# Patient Record
Sex: Male | Born: 1955 | Race: White | Hispanic: No | Marital: Married | Smoking: Never smoker
Health system: Southern US, Community
[De-identification: ages and names within clinical notes are randomized; demographics above are authoritative.]

## PROBLEM LIST (undated history)

## (undated) DIAGNOSIS — M1990 Unspecified osteoarthritis, unspecified site: Secondary | ICD-10-CM

## (undated) DIAGNOSIS — H9319 Tinnitus, unspecified ear: Secondary | ICD-10-CM

## (undated) DIAGNOSIS — C801 Malignant (primary) neoplasm, unspecified: Secondary | ICD-10-CM

## (undated) DIAGNOSIS — L0591 Pilonidal cyst without abscess: Secondary | ICD-10-CM

## (undated) DIAGNOSIS — Z9889 Other specified postprocedural states: Secondary | ICD-10-CM

## (undated) DIAGNOSIS — I82409 Acute embolism and thrombosis of unspecified deep veins of unspecified lower extremity: Secondary | ICD-10-CM

## (undated) DIAGNOSIS — H9190 Unspecified hearing loss, unspecified ear: Secondary | ICD-10-CM

## (undated) DIAGNOSIS — N390 Urinary tract infection, site not specified: Secondary | ICD-10-CM

## (undated) DIAGNOSIS — R351 Nocturia: Secondary | ICD-10-CM

## (undated) DIAGNOSIS — I10 Essential (primary) hypertension: Secondary | ICD-10-CM

## (undated) DIAGNOSIS — D6851 Activated protein C resistance: Secondary | ICD-10-CM

## (undated) DIAGNOSIS — R112 Nausea with vomiting, unspecified: Secondary | ICD-10-CM

## (undated) DIAGNOSIS — I82403 Acute embolism and thrombosis of unspecified deep veins of lower extremity, bilateral: Secondary | ICD-10-CM

## (undated) DIAGNOSIS — G479 Sleep disorder, unspecified: Secondary | ICD-10-CM

## (undated) DIAGNOSIS — J189 Pneumonia, unspecified organism: Secondary | ICD-10-CM

## (undated) HISTORY — DX: Unspecified osteoarthritis, unspecified site: M19.90

## (undated) HISTORY — DX: Essential (primary) hypertension: I10

## (undated) HISTORY — PX: OTHER SURGICAL HISTORY: SHX169

## (undated) HISTORY — DX: Activated protein C resistance: D68.51

## (undated) HISTORY — DX: Pilonidal cyst without abscess: L05.91

## (undated) HISTORY — PX: TONSILLECTOMY: SUR1361

## (undated) HISTORY — DX: Acute embolism and thrombosis of unspecified deep veins of lower extremity, bilateral: I82.403

## (undated) HISTORY — DX: Acute embolism and thrombosis of unspecified deep veins of unspecified lower extremity: I82.409

## (undated) HISTORY — PX: EYE SURGERY: SHX253

## (undated) HISTORY — DX: Sleep disorder, unspecified: G47.9

---
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
## 1976-12-01 HISTORY — PX: OTHER SURGICAL HISTORY: SHX169

## 2004-10-14 ENCOUNTER — Ambulatory Visit (HOSPITAL_COMMUNITY): Admission: RE | Admit: 2004-10-14 | Discharge: 2004-10-14 | Payer: Self-pay | Admitting: *Deleted

## 2004-10-14 ENCOUNTER — Encounter (INDEPENDENT_AMBULATORY_CARE_PROVIDER_SITE_OTHER): Payer: Self-pay | Admitting: *Deleted

## 2006-10-31 DIAGNOSIS — I82409 Acute embolism and thrombosis of unspecified deep veins of unspecified lower extremity: Secondary | ICD-10-CM

## 2006-10-31 HISTORY — DX: Acute embolism and thrombosis of unspecified deep veins of unspecified lower extremity: I82.409

## 2006-11-20 ENCOUNTER — Emergency Department (HOSPITAL_COMMUNITY): Admission: EM | Admit: 2006-11-20 | Discharge: 2006-11-20 | Payer: Self-pay | Admitting: Emergency Medicine

## 2006-12-29 ENCOUNTER — Ambulatory Visit: Payer: Self-pay | Admitting: Oncology

## 2007-03-19 ENCOUNTER — Ambulatory Visit: Payer: Self-pay | Admitting: Oncology

## 2007-03-24 LAB — CBC WITH DIFFERENTIAL/PLATELET
BASO%: 0.6 % (ref 0.0–2.0)
Eosinophils Absolute: 0.2 10*3/uL (ref 0.0–0.5)
HCT: 43.8 % (ref 38.7–49.9)
MCH: 31.3 pg (ref 28.0–33.4)
MCHC: 35 g/dL (ref 32.0–35.9)
MCV: 89.4 fL (ref 81.6–98.0)
MONO#: 0.4 10*3/uL (ref 0.1–0.9)
RBC: 4.9 10*6/uL (ref 4.20–5.71)
RDW: 14.6 % (ref 11.2–14.6)
WBC: 6.4 10*3/uL (ref 4.0–10.0)
lymph#: 1.4 10*3/uL (ref 0.9–3.3)

## 2007-03-24 LAB — PROTIME-INR

## 2007-03-24 LAB — MORPHOLOGY

## 2007-03-24 LAB — D-DIMER, QUANTITATIVE: D-Dimer, Quant: 0.22 ug/mL-FEU (ref 0.00–0.48)

## 2007-04-16 ENCOUNTER — Ambulatory Visit: Payer: Self-pay | Admitting: Vascular Surgery

## 2007-09-14 ENCOUNTER — Ambulatory Visit: Payer: Self-pay | Admitting: Oncology

## 2007-11-30 ENCOUNTER — Ambulatory Visit: Payer: Self-pay | Admitting: Oncology

## 2007-12-10 LAB — CBC WITH DIFFERENTIAL/PLATELET
BASO%: 0.2 % (ref 0.0–2.0)
EOS%: 2.5 % (ref 0.0–7.0)
Eosinophils Absolute: 0.2 10*3/uL (ref 0.0–0.5)
HCT: 43.2 % (ref 38.7–49.9)
HGB: 14.7 g/dL (ref 13.0–17.1)
MONO%: 7.2 % (ref 0.0–13.0)
NEUT#: 4.5 10*3/uL (ref 1.5–6.5)
NEUT%: 60.7 % (ref 40.0–75.0)
Platelets: 269 10*3/uL (ref 145–400)
RBC: 4.79 10*6/uL (ref 4.20–5.71)
RDW: 14.3 % (ref 11.2–14.6)
lymph#: 2.2 10*3/uL (ref 0.9–3.3)

## 2007-12-10 LAB — PROTIME-INR: INR: 3 (ref 2.00–3.50)

## 2008-05-18 IMAGING — CR DG CHEST 2V
1 series · 2 of 2 positions shown · non-contrast
Comparison: NONE

CLINICAL DATA: Hypertension. 

CHEST TWO VIEW (PA AND LATERAL)

[Series 1: view not recorded · 0.17mm/px · 2 of 2 slices shown]
[im 1/2]
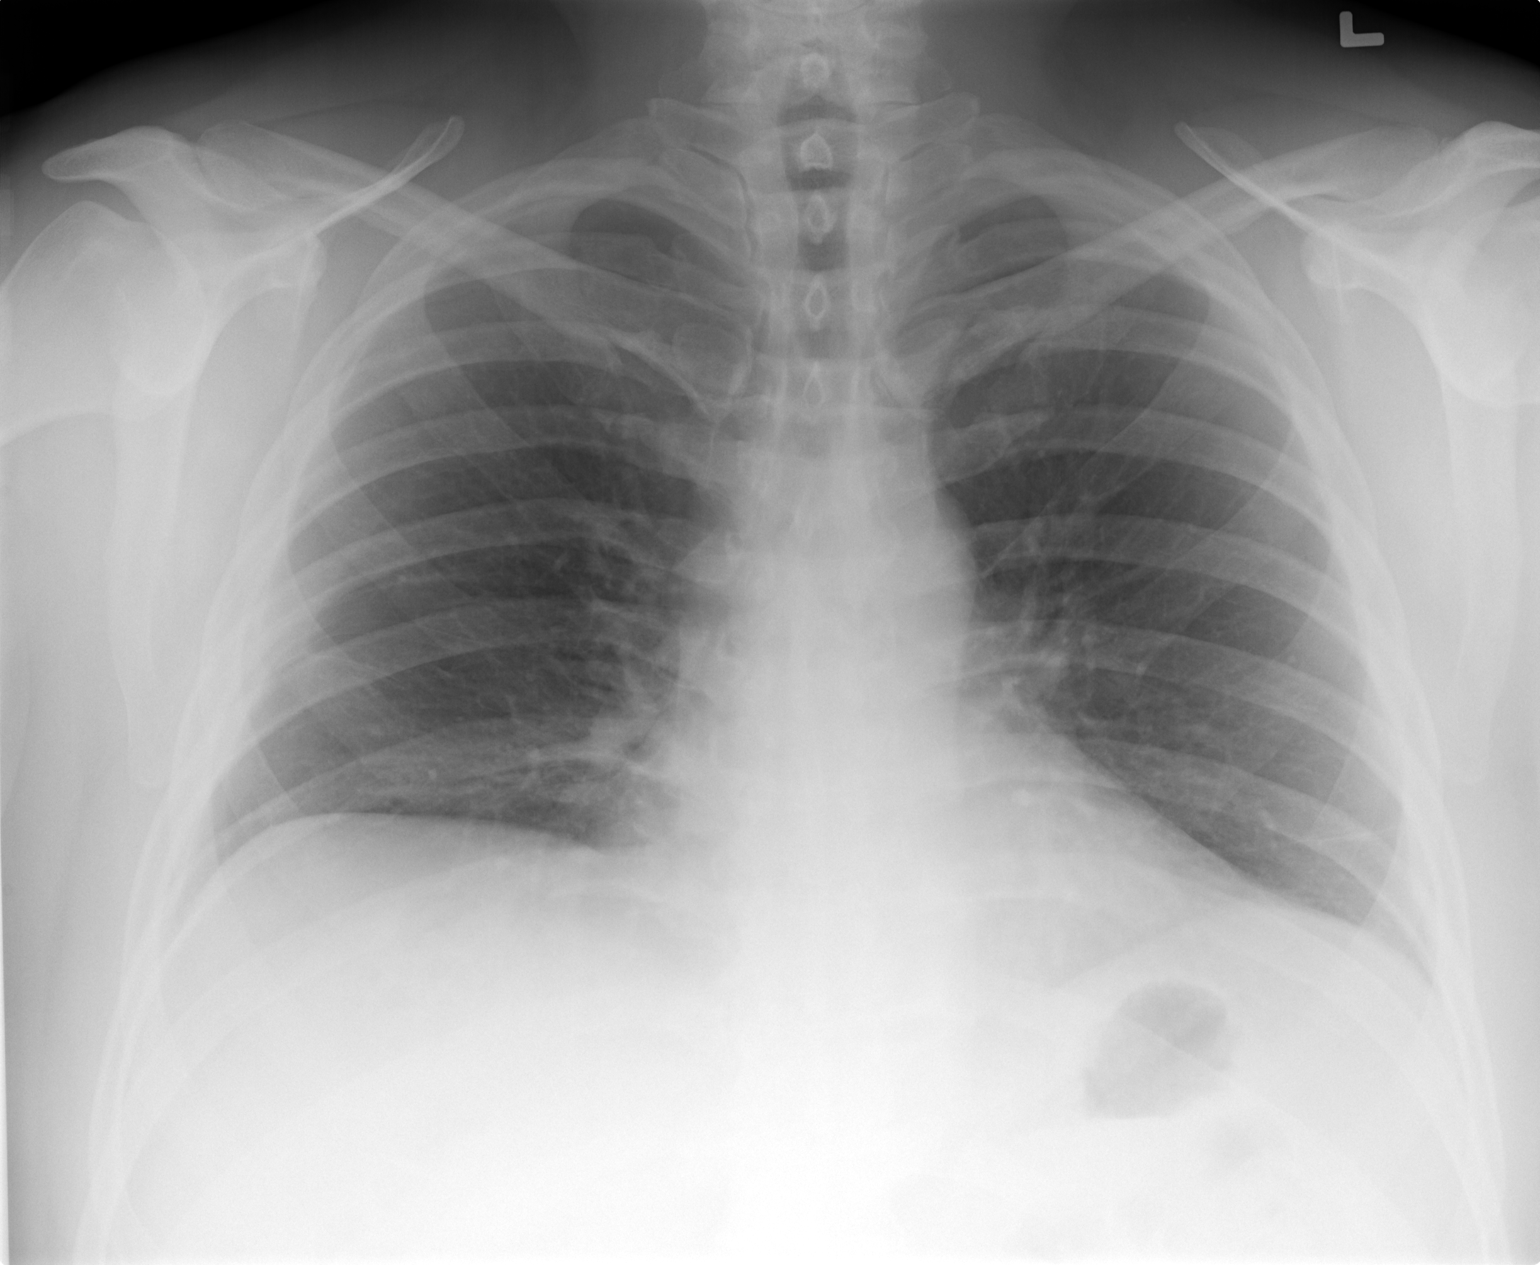
[im 2/2]
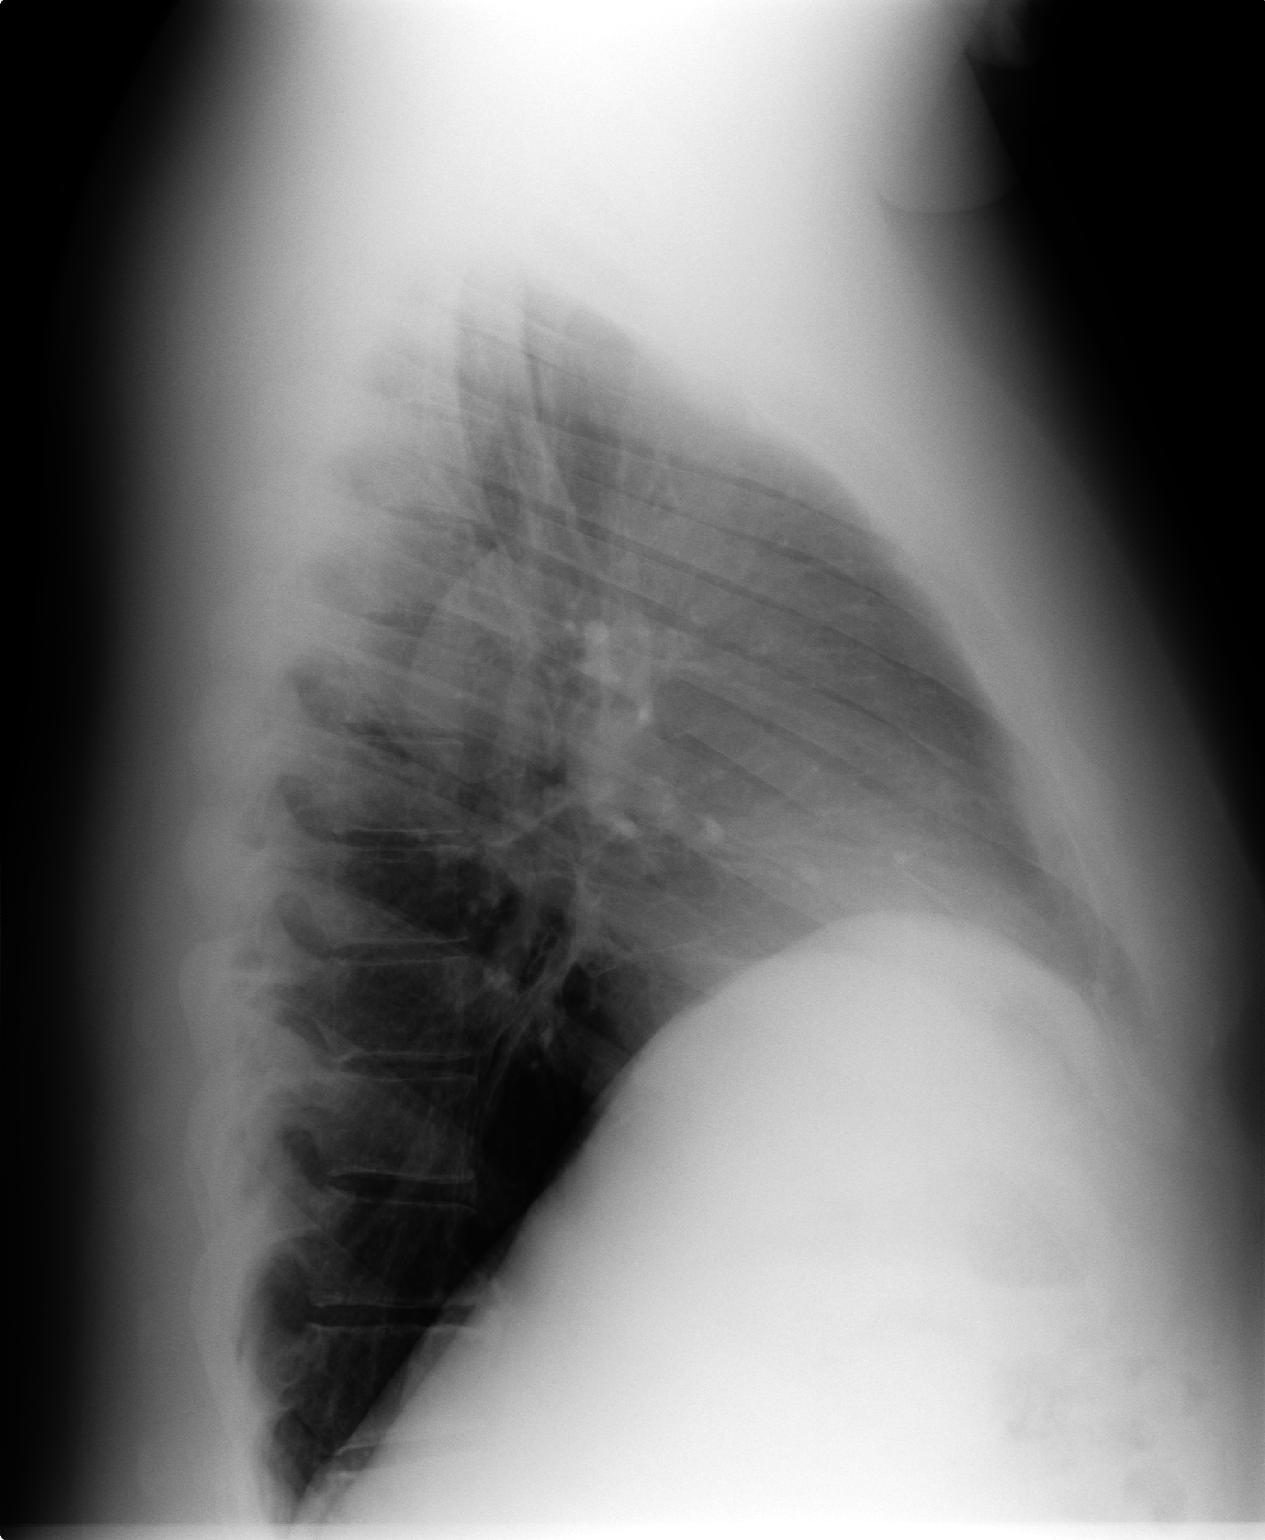

[2 of 2 positions shown; findings below may reference images not displayed]

FINDINGS: Comparison is made to a prior study dated [DATE]. The 
lungs are clear and well expanded.   Heart and pulmonary vessels 
are normal.  No significant abnormalities are noted in the 
regional skeleton.  No evidence of thoracic aortic aneurysm or 
dissection. 

electronically reviewed on [DATE] Dict Date: [DATE]  Tran 
Date:  [DATE] DAS  [REDACTED]

## 2009-12-21 ENCOUNTER — Ambulatory Visit: Payer: Self-pay | Admitting: Oncology

## 2009-12-25 LAB — CBC WITH DIFFERENTIAL/PLATELET
BASO%: 0.7 % (ref 0.0–2.0)
Basophils Absolute: 0 10*3/uL (ref 0.0–0.1)
Eosinophils Absolute: 0.2 10*3/uL (ref 0.0–0.5)
HCT: 47.4 % (ref 38.4–49.9)
LYMPH%: 29 % (ref 14.0–49.0)
MCH: 30.8 pg (ref 27.2–33.4)
MCV: 89.1 fL (ref 79.3–98.0)
NEUT#: 3.6 10*3/uL (ref 1.5–6.5)
NEUT%: 59.5 % (ref 39.0–75.0)
Platelets: 197 10*3/uL (ref 140–400)
lymph#: 1.7 10*3/uL (ref 0.9–3.3)

## 2009-12-25 LAB — PROTIME-INR
INR: 2 (ref 2.00–3.50)
Protime: 24 Seconds — ABNORMAL HIGH (ref 10.6–13.4)

## 2010-04-24 ENCOUNTER — Encounter: Admission: RE | Admit: 2010-04-24 | Discharge: 2010-04-24 | Payer: Self-pay | Admitting: Gastroenterology

## 2010-06-21 ENCOUNTER — Ambulatory Visit: Payer: Self-pay | Admitting: Oncology

## 2012-06-04 ENCOUNTER — Telehealth: Payer: Self-pay | Admitting: *Deleted

## 2012-06-04 NOTE — Telephone Encounter (Signed)
A script was faxed to [facility] stating please provide pt with size larger A.E.Hose 20-30 compression.

## 2013-08-23 ENCOUNTER — Telehealth: Payer: Self-pay | Admitting: *Deleted

## 2013-08-23 NOTE — Telephone Encounter (Signed)
Spoke with patient regarding pending prostate biopsy. Pt states he spoke with his PCP and they decided to wait 4-5 months for another lab check. Will be for repeat labs in January or February. Will let Dr Cyndie Chime know if he schedules a biopsy re: Coumadin

## 2014-01-02 ENCOUNTER — Telehealth: Payer: Self-pay | Admitting: *Deleted

## 2014-01-02 NOTE — Telephone Encounter (Signed)
[facility] with [facility] called.  Dr. Mar Daring is planning on doing an ultrasound and biopsy of the prostate.  He would like Dr. Beryle Beams to give clearance to stop his coumadin and determine if he needs a lovenox bridge.   They requested that we fax written documentation to [phone].  Angel's call back is [phone]. Spoke with [employer]  Let her know that Dr. Beryle Beams is out of town at present.  Let her know that we have not seen this patient in over 4 yrs. Last labs were with last MD visit on 12-25-09.  She will check with patient and see who has been monitoring his labs in the meantime.

## 2014-01-05 ENCOUNTER — Encounter: Payer: Self-pay | Admitting: General Surgery

## 2014-01-05 DIAGNOSIS — E669 Obesity, unspecified: Secondary | ICD-10-CM | POA: Insufficient documentation

## 2014-01-05 DIAGNOSIS — I10 Essential (primary) hypertension: Secondary | ICD-10-CM | POA: Insufficient documentation

## 2014-01-10 NOTE — Telephone Encounter (Signed)
Late Entry:  Spoke with pt 01/05/14 & Dr Melinda Crutch is monitoring his coumadin & he states that is is on coumadin by his choice & biopsy is planned for March 20, 2014 & he will talk with Dr Harrington Challenger to see if he will manage coumadin for surgery.  He states that he will get back with [place] if he needs Dr Azucena Freed help.  Pt reports that he would like to see Dr Benay Spice if he needs a hematologist in the future.  Dr. Beryle Beams was informed.

## 2014-05-04 ENCOUNTER — Ambulatory Visit (INDEPENDENT_AMBULATORY_CARE_PROVIDER_SITE_OTHER): Payer: Self-pay | Admitting: General Surgery

## 2014-05-08 ENCOUNTER — Telehealth (INDEPENDENT_AMBULATORY_CARE_PROVIDER_SITE_OTHER): Payer: Self-pay

## 2014-05-08 NOTE — Telephone Encounter (Signed)
Made Dr. Eulogio Ditch aware that his referred patient for bilateral inguinal hernias was a "no show", and did not reschedule.

## 2014-09-11 ENCOUNTER — Other Ambulatory Visit: Payer: Self-pay | Admitting: Urology

## 2014-09-11 DIAGNOSIS — C61 Malignant neoplasm of prostate: Secondary | ICD-10-CM

## 2014-10-09 ENCOUNTER — Ambulatory Visit (HOSPITAL_COMMUNITY)
Admission: RE | Admit: 2014-10-09 | Discharge: 2014-10-09 | Disposition: A | Discharge status: Home / Self Care | Payer: BC Managed Care – PPO | Source: Ambulatory Visit | Attending: Urology | Admitting: Urology

## 2014-10-09 DIAGNOSIS — N323 Diverticulum of bladder: Secondary | ICD-10-CM | POA: Diagnosis not present

## 2014-10-09 DIAGNOSIS — C61 Malignant neoplasm of prostate: Secondary | ICD-10-CM | POA: Diagnosis present

## 2014-10-09 MED ORDER — GADOBENATE DIMEGLUMINE 529 MG/ML IV SOLN
20.0000 mL | Freq: Once | INTRAVENOUS | Status: AC | PRN
  Administered 2014-10-09: 18 mL via INTRAVENOUS

## 2014-11-01 ENCOUNTER — Other Ambulatory Visit: Payer: Self-pay | Admitting: Urology

## 2014-11-03 ENCOUNTER — Other Ambulatory Visit (HOSPITAL_COMMUNITY): Payer: Self-pay | Admitting: Urology

## 2014-11-03 DIAGNOSIS — C61 Malignant neoplasm of prostate: Secondary | ICD-10-CM

## 2014-11-13 ENCOUNTER — Encounter (HOSPITAL_COMMUNITY)
Admission: RE | Admit: 2014-11-13 | Discharge: 2014-11-13 | Disposition: A | Discharge status: Home / Self Care | Payer: BC Managed Care – PPO | Source: Ambulatory Visit | Attending: Urology | Admitting: Urology

## 2014-11-13 ENCOUNTER — Ambulatory Visit (HOSPITAL_COMMUNITY)
Admission: RE | Admit: 2014-11-13 | Discharge: 2014-11-13 | Disposition: A | Discharge status: Home / Self Care | Payer: BC Managed Care – PPO | Source: Ambulatory Visit | Attending: Urology | Admitting: Urology

## 2014-11-13 DIAGNOSIS — C61 Malignant neoplasm of prostate: Secondary | ICD-10-CM

## 2014-11-13 MED ORDER — TECHNETIUM TC 99M MEDRONATE IV KIT
26.1000 | PACK | Freq: Once | INTRAVENOUS | Status: AC | PRN
  Administered 2014-11-13: 26.1 via INTRAVENOUS

## 2014-12-11 NOTE — Progress Notes (Addendum)
Call to Dr Lynne Logan oddice requested   Orders be put  in Epic surgery 12-25-14 pre op 12-19-14 Thanks

## 2014-12-15 ENCOUNTER — Other Ambulatory Visit (HOSPITAL_COMMUNITY): Payer: Self-pay | Admitting: *Deleted

## 2014-12-15 NOTE — Patient Instructions (Addendum)
[facility]  12/15/2014   Your procedure is scheduled on: Monday January 25th, 2016  Report to [facility] Main  Entrance and follow signs to               [place] at 1000 AM.  Call this number if you have problems the morning of surgery [phone]   Remember: follow all bowel prep instructions  Do not eat food :After Midnight Saturday night, clear liquids all day Sunday 12-24-2014, no clear liquds after midnight Sunday night.     Take these medicines the morning of surgery with A SIP OF WATER: flomax                               You may not have any metal on your body including hair pins and              piercings  Do not wear jewelry, make-up, lotions, powders or perfumes.             Do not wear nail polish.  Do not shave  48 hours prior to surgery.              Men may shave face and neck.   Do not bring valuables to the hospital. [place].  Contacts, dentures or bridgework may not be worn into surgery.  Leave suitcase in the car. After surgery it may be brought to your room.     Patients discharged the day of surgery will not be allowed to drive home.  Name and phone number of your driver:  Special Instructions: N/A              Please read over the following fact sheets you were given: _____________________________________________________________________             [facility] - Preparing for Surgery Before surgery, you can play an important role.  Because skin is not sterile, your skin needs to be as free of germs as possible.  You can reduce the number of germs on your skin by washing with CHG (chlorahexidine gluconate) soap before surgery.  CHG is an antiseptic cleaner which kills germs and bonds with the skin to continue killing germs even after washing. Please DO NOT use if you have an allergy to CHG or antibacterial soaps.  If your skin becomes reddened/irritated stop using the  CHG and inform your nurse when you arrive at Short Stay. Do not shave (including legs and underarms) for at least 48 hours prior to the first CHG shower.  You may shave your face/neck. Please follow these instructions carefully:  1.  Shower with CHG Soap the night before surgery and the  morning of Surgery.  2.  If you choose to wash your hair, wash your hair first as usual with your  normal  shampoo.  3.  After you shampoo, rinse your hair and body thoroughly to remove the  shampoo.                           4.  Use CHG as you would any other liquid soap.  You can apply chg directly  to the skin and wash  Gently with a scrungie or clean washcloth.  5.  Apply the CHG Soap to your body ONLY FROM THE NECK DOWN.   Do not use on face/ open                           Wound or open sores. Avoid contact with eyes, ears mouth and genitals (private parts).                       Wash face,  Genitals (private parts) with your normal soap.             6.  Wash thoroughly, paying special attention to the area where your surgery  will be performed.  7.  Thoroughly rinse your body with warm water from the neck down.  8.  DO NOT shower/wash with your normal soap after using and rinsing off  the CHG Soap.                9.  Pat yourself dry with a clean towel.            10.  Wear clean pajamas.            11.  Place clean sheets on your bed the night of your first shower and do not  sleep with pets. Day of Surgery : Do not apply any lotions/deodorants the morning of surgery.  Please wear clean clothes to the hospital/surgery center.  FAILURE TO FOLLOW THESE INSTRUCTIONS MAY RESULT IN THE CANCELLATION OF YOUR SURGERY PATIENT SIGNATURE_________________________________  NURSE SIGNATURE__________________________________  ________________________________________________________________________   [facility]  An incentive spirometer is a tool that can help keep your lungs clear  and active. This tool measures how well you are filling your lungs with each breath. Taking long deep breaths may help reverse or decrease the chance of developing breathing (pulmonary) problems (especially infection) following:  A long period of time when you are unable to move or be active. BEFORE THE PROCEDURE   If the spirometer includes an indicator to show your best effort, your nurse or respiratory therapist will set it to a desired goal.  If possible, sit up straight or lean slightly forward. Try not to slouch.  Hold the incentive spirometer in an upright position. INSTRUCTIONS FOR USE   Sit on the edge of your bed if possible, or sit up as far as you can in bed or on a chair.  Hold the incentive spirometer in an upright position.  Breathe out normally.  Place the mouthpiece in your mouth and seal your lips tightly around it.  Breathe in slowly and as deeply as possible, raising the piston or the ball toward the top of the column.  Hold your breath for 3-5 seconds or for as long as possible. Allow the piston or ball to fall to the bottom of the column.  Remove the mouthpiece from your mouth and breathe out normally.  Rest for a few seconds and repeat Steps 1 through 7 at least 10 times every 1-2 hours when you are awake. Take your time and take a few normal breaths between deep breaths.  The spirometer may include an indicator to show your best effort. Use the indicator as a goal to work toward during each repetition.  After each set of 10 deep breaths, practice coughing to be sure your lungs are clear. If you have an incision (the cut made at the time of surgery),  support your incision when coughing by placing a pillow or rolled up towels firmly against it. Once you are able to get out of bed, walk around indoors and cough well. You may stop using the incentive spirometer when instructed by your caregiver.  RISKS AND COMPLICATIONS  Take your time so you do not get dizzy or  light-headed.  If you are in pain, you may need to take or ask for pain medication before doing incentive spirometry. It is harder to take a deep breath if you are having pain. AFTER USE  Rest and breathe slowly and easily.  It can be helpful to keep track of a log of your progress. Your caregiver can provide you with a simple table to help with this. If you are using the spirometer at home, follow these instructions: [place] IF:   You are having difficultly using the spirometer.  You have trouble using the spirometer as often as instructed.  Your pain medication is not giving enough relief while using the spirometer.  You develop fever of 100.5 F (38.1 C) or higher. SEEK IMMEDIATE MEDICAL CARE IF:   You cough up bloody sputum that had not been present before.  You develop fever of 102 F (38.9 C) or greater.  You develop worsening pain at or near the incision site. MAKE SURE YOU:   Understand these instructions.  Will watch your condition.  Will get help right away if you are not doing well or get worse. Document Released: 03/30/2007 Document Revised: 02/09/2012 Document Reviewed: 05/31/2007 Exit[employer] Patient Information 2014 [employer], [place].   ________________________________________________________________________  WHAT IS A BLOOD TRANSFUSION? Blood Transfusion Information  A transfusion is the replacement of blood or some of its parts. Blood is made up of multiple cells which provide different functions.  Red blood cells carry oxygen and are used for blood loss replacement.  White blood cells fight against infection.  Platelets control bleeding.  Plasma helps clot blood.  Other blood products are available for specialized needs, such as hemophilia or other clotting disorders. BEFORE THE TRANSFUSION  Who gives blood for transfusions?   Healthy volunteers who are fully evaluated to make sure their blood is safe. This is blood bank  blood. Transfusion therapy is the safest it has ever been in the practice of medicine. Before blood is taken from a donor, a complete history is taken to make sure that person has no history of diseases nor engages in risky social behavior (examples are intravenous drug use or sexual activity with multiple partners). The donor's travel history is screened to minimize risk of transmitting infections, such as malaria. The donated blood is tested for signs of infectious diseases, such as HIV and hepatitis. The blood is then tested to be sure it is compatible with you in order to minimize the chance of a transfusion reaction. If you or a relative donates blood, this is often done in anticipation of surgery and is not appropriate for emergency situations. It takes many days to process the donated blood. RISKS AND COMPLICATIONS Although transfusion therapy is very safe and saves many lives, the main dangers of transfusion include:   Getting an infectious disease.  Developing a transfusion reaction. This is an allergic reaction to something in the blood you were given. Every precaution is taken to prevent this. The decision to have a blood transfusion has been considered carefully by your caregiver before blood is given. Blood is not given unless the benefits outweigh the risks. AFTER THE TRANSFUSION  Right after receiving a blood transfusion, you will usually feel much better and more energetic. This is especially true if your red blood cells have gotten low (anemic). The transfusion raises the level of the red blood cells which carry oxygen, and this usually causes an energy increase.  The nurse administering the transfusion will monitor you carefully for complications. HOME CARE INSTRUCTIONS  No special instructions are needed after a transfusion. You may find your energy is better. Speak with your caregiver about any limitations on activity for underlying diseases you may have. SEEK MEDICAL CARE IF:    Your condition is not improving after your transfusion.  You develop redness or irritation at the intravenous (IV) site. SEEK IMMEDIATE MEDICAL CARE IF:  Any of the following symptoms occur over the next 12 hours:  Shaking chills.  You have a temperature by mouth above 102 F (38.9 C), not controlled by medicine.  Chest, back, or muscle pain.  People around you feel you are not acting correctly or are confused.  Shortness of breath or difficulty breathing.  Dizziness and fainting.  You get a rash or develop hives.  You have a decrease in urine output.  Your urine turns a dark color or changes to pink, red, or brown. Any of the following symptoms occur over the next 10 days:  You have a temperature by mouth above 102 F (38.9 C), not controlled by medicine.  Shortness of breath.  Weakness after normal activity.  The white part of the eye turns yellow (jaundice).  You have a decrease in the amount of urine or are urinating less often.  Your urine turns a dark color or changes to pink, red, or brown. Document Released: 11/14/2000 Document Revised: 02/09/2012 Document Reviewed: 07/03/2008 [facility] Patient Information 2014 [place], [place].  _______________________________________________________________________

## 2014-12-19 ENCOUNTER — Other Ambulatory Visit: Payer: Self-pay | Admitting: Urology

## 2014-12-19 ENCOUNTER — Encounter (HOSPITAL_COMMUNITY): Payer: Self-pay

## 2014-12-19 ENCOUNTER — Ambulatory Visit (HOSPITAL_COMMUNITY)
Admission: RE | Admit: 2014-12-19 | Discharge: 2014-12-19 | Disposition: A | Discharge status: Home / Self Care | Payer: BLUE CROSS/BLUE SHIELD | Source: Ambulatory Visit | Attending: Urology | Admitting: Urology

## 2014-12-19 ENCOUNTER — Encounter (HOSPITAL_COMMUNITY)
Admission: RE | Admit: 2014-12-19 | Discharge: 2014-12-19 | Disposition: A | Discharge status: Home / Self Care | Payer: BLUE CROSS/BLUE SHIELD | Source: Ambulatory Visit | Attending: Urology | Admitting: Urology

## 2014-12-19 ENCOUNTER — Encounter (INDEPENDENT_AMBULATORY_CARE_PROVIDER_SITE_OTHER): Payer: Self-pay

## 2014-12-19 DIAGNOSIS — Z01811 Encounter for preprocedural respiratory examination: Secondary | ICD-10-CM | POA: Insufficient documentation

## 2014-12-19 DIAGNOSIS — I10 Essential (primary) hypertension: Secondary | ICD-10-CM | POA: Insufficient documentation

## 2014-12-19 DIAGNOSIS — C61 Malignant neoplasm of prostate: Secondary | ICD-10-CM | POA: Diagnosis not present

## 2014-12-19 HISTORY — DX: Nausea with vomiting, unspecified: R11.2

## 2014-12-19 HISTORY — DX: Malignant (primary) neoplasm, unspecified: C80.1

## 2014-12-19 HISTORY — DX: Other specified postprocedural states: Z98.890

## 2014-12-19 LAB — PROTIME-INR
INR: 2.76 — AB (ref 0.00–1.49)
Prothrombin Time: 29.4 seconds — ABNORMAL HIGH (ref 11.6–15.2)

## 2014-12-19 LAB — BASIC METABOLIC PANEL
ANION GAP: 5 (ref 5–15)
BUN: 25 mg/dL — ABNORMAL HIGH (ref 6–23)
CALCIUM: 8.9 mg/dL (ref 8.4–10.5)
CO2: 30 mmol/L (ref 19–32)
Chloride: 104 mEq/L (ref 96–112)
Creatinine, Ser: 1.3 mg/dL (ref 0.50–1.35)
GFR, EST AFRICAN AMERICAN: 67 mL/min — AB (ref >90)
GFR, EST NON AFRICAN AMERICAN: 58 mL/min — AB (ref >90)
GLUCOSE: 89 mg/dL (ref 70–99)
Potassium: 4.2 mmol/L (ref 3.5–5.1)
SODIUM: 139 mmol/L (ref 135–145)

## 2014-12-19 LAB — CBC
HCT: 44.4 % (ref 39.0–52.0)
Hemoglobin: 14.9 g/dL (ref 13.0–17.0)
MCH: 31.4 pg (ref 26.0–34.0)
MCHC: 33.6 g/dL (ref 30.0–36.0)
MCV: 93.5 fL (ref 78.0–100.0)
Platelets: 208 10*3/uL (ref 150–400)
RBC: 4.75 MIL/uL (ref 4.22–5.81)
RDW: 13.3 % (ref 11.5–15.5)
WBC: 5.2 10*3/uL (ref 4.0–10.5)

## 2014-12-19 LAB — APTT: aPTT: 41 seconds — ABNORMAL HIGH (ref 24–37)

## 2014-12-19 NOTE — Progress Notes (Signed)
Fax received from dr borden, draw pt day of surgery, fax placed on patient chart, pt is already ordered day of surgery per anesthesia guidelines.

## 2014-12-19 NOTE — Progress Notes (Signed)
Spoke with dr Landry Dyke, pt is to stop phemtermine prior to surgery, pt instructed to stop phentermine

## 2014-12-19 NOTE — Progress Notes (Signed)
bmet results routed to dr borden office by epic

## 2014-12-22 NOTE — H&P (Signed)
Chief Complaint Prostate Cancer     History of Present Illness [name] is a 59 year old gentleman who was diagnosed with clinical stage T1c prostate cancer initially in April 2015. His PSA was 4.35 and he underwent a biopsy on 03/20/14 which demonstrated Gleason 3+3=6 adenocarcinoma of the prostate with 2 out of 12 biopsy cores involved (60% and 20%). He initially chose to proceed with active surveillance. His most recent PSA was 3.6. However, an MRI of the prostate on 10/09/14 showed a 12 mm right mid lateral hypointense nodule with restricted diffusion suggesting higher grade disease and a question of extraprostatic extension and neurovascular bundle involvement. He was also noted to have another 14 mm left base hypointense nodule with restricted diffusion. This caused him to rethink his options and he interested in surgical treatment now. He has no family history of prostate cancer except a maternal grandfather.   He also has a history of recurrent UTIs and elevated post void residual urine. He describes one episode of asymptomatic urinary tract infection that was apparently noted to be E. coli and treated with antibiotics alone. 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  He has a right inguinal hernia and is scheduled to be evaluated by Dr. Alwyn Pea. He has been only mildly symptomatic from this.  ** His medical history is significant for a history of DVT and known Factor V Leiden. He is on chronic treatment with Coumadin and is followed by Dr. Beryle Beams.  TNM stage: cT3a N0 Mx PSA: 3.62 Gleason score: 3+3=6 Biopsy (03/20/14): 2/12 cores positive - L lateral base (60%), R base (20%) Prostate volume: 29.3 cc  Urinary function: IPSS is 14. Erectile function: SHIM score is 3. He and his wife are admittedly not very sexually active over the past few years. He states that he can most that time obtain an erection and  would be adequate for intercourse but does have difficulty maintaining an erection.   Past Medical History Problems  1. History of Factor V Leiden mutation (D68.51) 2. History of hypertension (Z86.79) 3. 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 History of No Surgical Problems  Current Meds 1. CeleBREX 200 MG Oral Capsule;  Therapy: (Recorded:19Sep2012) to Recorded 2. Coumadin 10 MG Oral Tablet;  Therapy: (Recorded:19Sep2012) to Recorded 3. Hyzaar TABS;  Therapy: (Recorded:19Sep2012) to Recorded 4. Multi-Vitamin Oral Tablet;  Therapy: (Recorded:04Aug2014) to Recorded 5. Phentermine HCl TABS; 100mg ;  Therapy: (Recorded:26Oct2012) to Recorded 6. Tamsulosin HCl - 0.4 MG Oral Capsule; TAKE ONE CAPSULE EVERY DAY;  Therapy: [id number] to (Evaluate:29May2016)  Requested for: 03Aug2015; Last  Rx:03Aug2015 Ordered 7. Topamax 25 MG Oral Tablet;  Therapy: (Recorded:26Oct2012) to Recorded 8. Vitamin B Complex CAPS;  Therapy: (Recorded:23Nov2015) to Recorded  Allergies Medication  1. No Known Drug Allergies  Family History Problems  1. Family history of Death In The Family Mother 2. Family history of Family Health Status Number Of Children   3 sons 3. Family history of Nonfunctioning Kidney : Paternal Grandmother 35. Family history of Prostate Cancer : Maternal Grandfather 5. Family history of Pulmonary Embolism : Mother  Social History Problems    Denied: History of Alcohol Use   Marital History - Currently Married   Never A Smoker  Review of Systems Genitourinary, constitutional, skin, eye, otolaryngeal, hematologic/lymphatic, cardiovascular, pulmonary, endocrine, musculoskeletal, gastrointestinal, neurological and psychiatric system(s) were reviewed and  pertinent findings if present are noted and are otherwise negative.  Cardiovascular: no chest pain and no leg swelling.  Respiratory: no shortness of breath.     Vitals  Weight: 253 lb  BMI Calculated: 33.38 BSA Calculated: 2.38   Physical Exam Constitutional: Well nourished and well developed . No acute distress.  ENT:. The ears and nose are normal in appearance.  Neck: The appearance of the neck is normal and no neck mass is present.  Pulmonary: No respiratory distress and normal respiratory rhythm and effort.  Cardiovascular: Heart rate and rhythm are normal . No peripheral edema.  Abdomen: The abdomen is obese. The abdomen is soft and nontender. No masses are palpated. No CVA tenderness. No hernias are palpable. No hepatosplenomegaly noted.   Assessment Assessed  1. Adenocarcinoma of prostate (C61)   Discussion/Summary 1. Prostate cancer: He has elected to proceed with surgical therapy of his prostate cancer. Considering his Factor V Leiden, I spoke with Dr. Beryle Beams and he has been bridged with Lovenox having stopped his Coumadin 5 days ago. He also has elected to undergo excision of his large bladder diverticulum. He understands the risk of needed a left ureteral reimplant and will have a ureteral stent placed preoperatively as a precaution. I discussed the potential benefits and risks of the procedure, side effects of the proposed treatment, the likelihood of the patient achieving the goals of the procedure, and any potential problems that might occur during the procedure or recuperation.

## 2014-12-25 ENCOUNTER — Encounter (HOSPITAL_COMMUNITY): Payer: Self-pay | Admitting: Anesthesiology

## 2014-12-25 ENCOUNTER — Inpatient Hospital Stay (HOSPITAL_COMMUNITY): Payer: BLUE CROSS/BLUE SHIELD

## 2014-12-25 ENCOUNTER — Inpatient Hospital Stay (HOSPITAL_COMMUNITY)
Admission: RE | Admit: 2014-12-25 | Discharge: 2014-12-27 | DRG: 707 | Disposition: A | Discharge status: Home / Self Care | Payer: BLUE CROSS/BLUE SHIELD | Source: Ambulatory Visit | Attending: Urology | Admitting: Urology

## 2014-12-25 ENCOUNTER — Inpatient Hospital Stay (HOSPITAL_COMMUNITY): Payer: BLUE CROSS/BLUE SHIELD | Admitting: Anesthesiology

## 2014-12-25 ENCOUNTER — Encounter (HOSPITAL_COMMUNITY)
Admission: RE | Disposition: A | Discharge status: Home / Self Care | Payer: Self-pay | Source: Ambulatory Visit | Attending: Urology

## 2014-12-25 DIAGNOSIS — Z7901 Long term (current) use of anticoagulants: Secondary | ICD-10-CM

## 2014-12-25 DIAGNOSIS — D6851 Activated protein C resistance: Secondary | ICD-10-CM | POA: Diagnosis present

## 2014-12-25 DIAGNOSIS — N323 Diverticulum of bladder: Secondary | ICD-10-CM | POA: Diagnosis present

## 2014-12-25 DIAGNOSIS — Z87440 Personal history of urinary (tract) infections: Secondary | ICD-10-CM | POA: Diagnosis not present

## 2014-12-25 DIAGNOSIS — C61 Malignant neoplasm of prostate: Secondary | ICD-10-CM | POA: Diagnosis present

## 2014-12-25 DIAGNOSIS — I10 Essential (primary) hypertension: Secondary | ICD-10-CM | POA: Diagnosis present

## 2014-12-25 DIAGNOSIS — K4090 Unilateral inguinal hernia, without obstruction or gangrene, not specified as recurrent: Secondary | ICD-10-CM | POA: Diagnosis present

## 2014-12-25 DIAGNOSIS — Z79899 Other long term (current) drug therapy: Secondary | ICD-10-CM

## 2014-12-25 DIAGNOSIS — Z86718 Personal history of other venous thrombosis and embolism: Secondary | ICD-10-CM

## 2014-12-25 HISTORY — PX: ROBOT ASSISTED LAPAROSCOPIC RADICAL PROSTATECTOMY: SHX5141

## 2014-12-25 HISTORY — PX: LYMPHADENECTOMY: SHX5960

## 2014-12-25 HISTORY — PX: ROBOTIC ASSISTED LAPAROSCOPIC BLADDER DIVERTICULECTOMY: SHX6079

## 2014-12-25 HISTORY — PX: CYSTOSCOPY WITH STENT PLACEMENT: SHX5790

## 2014-12-25 LAB — TYPE AND SCREEN
ABO/RH(D): A POS
Antibody Screen: NEGATIVE

## 2014-12-25 LAB — HEMOGLOBIN AND HEMATOCRIT, BLOOD
HCT: 39.9 % (ref 39.0–52.0)
Hemoglobin: 13.5 g/dL (ref 13.0–17.0)

## 2014-12-25 LAB — ABO/RH: ABO/RH(D): A POS

## 2014-12-25 LAB — PROTIME-INR
INR: 1.04 (ref 0.00–1.49)
Prothrombin Time: 13.7 seconds (ref 11.6–15.2)

## 2014-12-25 SURGERY — ROBOTIC ASSISTED LAPAROSCOPIC RADICAL PROSTATECTOMY LEVEL 2
Anesthesia: General

## 2014-12-25 MED ORDER — OXYBUTYNIN CHLORIDE 5 MG PO TABS: 5.0000 mg | ORAL_TABLET | Freq: Three times a day (TID) | ORAL | Status: DC | PRN

## 2014-12-25 MED ORDER — ONDANSETRON HCL 4 MG/2ML IJ SOLN
INTRAMUSCULAR | Status: AC
  Filled 2014-12-25: qty 2

## 2014-12-25 MED ORDER — PROPOFOL 10 MG/ML IV BOLUS
INTRAVENOUS | Status: AC
  Filled 2014-12-25: qty 20

## 2014-12-25 MED ORDER — SUFENTANIL CITRATE 50 MCG/ML IV SOLN
INTRAVENOUS | Status: DC | PRN
  Administered 2014-12-25: 10 ug via INTRAVENOUS
  Administered 2014-12-25 (×2): 5 ug via INTRAVENOUS

## 2014-12-25 MED ORDER — BUPIVACAINE-EPINEPHRINE (PF) 0.25% -1:200000 IJ SOLN
INTRAMUSCULAR | Status: AC
  Filled 2014-12-25: qty 30

## 2014-12-25 MED ORDER — ONDANSETRON HCL 4 MG/2ML IJ SOLN: 4.0000 mg | Freq: Once | INTRAMUSCULAR | Status: DC | PRN

## 2014-12-25 MED ORDER — HYDROCODONE-ACETAMINOPHEN 5-325 MG PO TABS
1.0000 | ORAL_TABLET | ORAL | Status: DC | PRN
  Administered 2014-12-26 (×3): 2 via ORAL
  Administered 2014-12-27 (×2): 1 via ORAL
  Filled 2014-12-25: qty 1
  Filled 2014-12-25: qty 2
  Filled 2014-12-25: qty 1
  Filled 2014-12-25 (×2): qty 2

## 2014-12-25 MED ORDER — PROPOFOL 10 MG/ML IV BOLUS
INTRAVENOUS | Status: DC | PRN
  Administered 2014-12-25: 200 mg via INTRAVENOUS

## 2014-12-25 MED ORDER — KETOROLAC TROMETHAMINE 15 MG/ML IJ SOLN
INTRAMUSCULAR | Status: AC
  Filled 2014-12-25: qty 1

## 2014-12-25 MED ORDER — ADULT MULTIVITAMIN W/MINERALS CH
1.0000 | ORAL_TABLET | Freq: Every day | ORAL | Status: DC
  Administered 2014-12-25 – 2014-12-27 (×3): 1 via ORAL
  Filled 2014-12-25 (×3): qty 1

## 2014-12-25 MED ORDER — LACTATED RINGERS IV SOLN
INTRAVENOUS | Status: DC
  Administered 2014-12-25 – 2014-12-26 (×2): via INTRAVENOUS

## 2014-12-25 MED ORDER — CISATRACURIUM BESYLATE 20 MG/10ML IV SOLN
INTRAVENOUS | Status: AC
  Filled 2014-12-25: qty 10

## 2014-12-25 MED ORDER — KETOROLAC TROMETHAMINE 15 MG/ML IJ SOLN
15.0000 mg | Freq: Four times a day (QID) | INTRAMUSCULAR | Status: AC
  Administered 2014-12-25 – 2014-12-27 (×6): 15 mg via INTRAVENOUS
  Filled 2014-12-25 (×7): qty 1

## 2014-12-25 MED ORDER — ACETAMINOPHEN 325 MG PO TABS: 650.0000 mg | ORAL_TABLET | ORAL | Status: DC | PRN

## 2014-12-25 MED ORDER — LACTATED RINGERS IV SOLN
INTRAVENOUS | Status: DC | PRN
  Administered 2014-12-25: 1000 mL

## 2014-12-25 MED ORDER — METOCLOPRAMIDE HCL 5 MG/ML IJ SOLN
INTRAMUSCULAR | Status: DC | PRN
  Administered 2014-12-25: 10 mg via INTRAVENOUS

## 2014-12-25 MED ORDER — SUFENTANIL CITRATE 50 MCG/ML IV SOLN
INTRAVENOUS | Status: AC
  Filled 2014-12-25: qty 1

## 2014-12-25 MED ORDER — DEXAMETHASONE SODIUM PHOSPHATE 10 MG/ML IJ SOLN
INTRAMUSCULAR | Status: AC
  Filled 2014-12-25: qty 1

## 2014-12-25 MED ORDER — NEOSTIGMINE METHYLSULFATE 10 MG/10ML IV SOLN
INTRAVENOUS | Status: DC | PRN
  Administered 2014-12-25 (×2): 5 mg via INTRAVENOUS

## 2014-12-25 MED ORDER — DEXAMETHASONE SODIUM PHOSPHATE 10 MG/ML IJ SOLN
INTRAMUSCULAR | Status: DC | PRN
  Administered 2014-12-25: 10 mg via INTRAVENOUS

## 2014-12-25 MED ORDER — GLYCOPYRROLATE 0.2 MG/ML IJ SOLN
INTRAMUSCULAR | Status: DC | PRN
  Administered 2014-12-25: .8 mg via INTRAVENOUS

## 2014-12-25 MED ORDER — SODIUM CHLORIDE 0.9 % IJ SOLN
INTRAMUSCULAR | Status: AC
  Filled 2014-12-25: qty 10

## 2014-12-25 MED ORDER — HYDROMORPHONE HCL 1 MG/ML IJ SOLN
INTRAMUSCULAR | Status: AC
  Filled 2014-12-25: qty 1

## 2014-12-25 MED ORDER — LOSARTAN POTASSIUM 50 MG PO TABS
100.0000 mg | ORAL_TABLET | Freq: Every day | ORAL | Status: DC
  Administered 2014-12-25 – 2014-12-27 (×3): 100 mg via ORAL
  Filled 2014-12-25 (×3): qty 2

## 2014-12-25 MED ORDER — HYDROMORPHONE HCL 1 MG/ML IJ SOLN
0.2500 mg | INTRAMUSCULAR | Status: DC | PRN
  Administered 2014-12-25: 0.5 mg via INTRAVENOUS

## 2014-12-25 MED ORDER — ONDANSETRON HCL 4 MG/2ML IJ SOLN
INTRAMUSCULAR | Status: DC | PRN
  Administered 2014-12-25: 4 mg via INTRAVENOUS

## 2014-12-25 MED ORDER — HYDROCHLOROTHIAZIDE 25 MG PO TABS
25.0000 mg | ORAL_TABLET | Freq: Every day | ORAL | Status: DC
  Administered 2014-12-25 – 2014-12-27 (×3): 25 mg via ORAL
  Filled 2014-12-25 (×3): qty 1

## 2014-12-25 MED ORDER — CEFAZOLIN SODIUM-DEXTROSE 2-3 GM-% IV SOLR
INTRAVENOUS | Status: AC
  Filled 2014-12-25: qty 50

## 2014-12-25 MED ORDER — STERILE WATER FOR IRRIGATION IR SOLN
Status: DC | PRN
  Administered 2014-12-25: 1000 mL

## 2014-12-25 MED ORDER — BUPIVACAINE-EPINEPHRINE 0.25% -1:200000 IJ SOLN
INTRAMUSCULAR | Status: DC | PRN
  Administered 2014-12-25: 30 mL

## 2014-12-25 MED ORDER — PHENTERMINE HCL 37.5 MG PO TABS
37.5000 mg | ORAL_TABLET | Freq: Every morning | ORAL | Status: DC
Start: 2014-12-26 — End: 2014-12-27

## 2014-12-25 MED ORDER — NEOSTIGMINE METHYLSULFATE 10 MG/10ML IV SOLN
INTRAVENOUS | Status: AC
  Filled 2014-12-25: qty 1

## 2014-12-25 MED ORDER — SODIUM CHLORIDE 0.9 % IR SOLN
Status: DC | PRN
  Administered 2014-12-25: 1000 mL via INTRAVESICAL

## 2014-12-25 MED ORDER — MIDAZOLAM HCL 5 MG/5ML IJ SOLN
INTRAMUSCULAR | Status: DC | PRN
  Administered 2014-12-25: 2 mg via INTRAVENOUS

## 2014-12-25 MED ORDER — CEFAZOLIN SODIUM-DEXTROSE 2-3 GM-% IV SOLR
2.0000 g | INTRAVENOUS | Status: AC
  Administered 2014-12-25 (×2): 2 g via INTRAVENOUS

## 2014-12-25 MED ORDER — HYDROCODONE-ACETAMINOPHEN 5-325 MG PO TABS: 1.0000 | ORAL_TABLET | Freq: Four times a day (QID) | ORAL | Status: DC | PRN

## 2014-12-25 MED ORDER — METOCLOPRAMIDE HCL 5 MG/ML IJ SOLN
INTRAMUSCULAR | Status: AC
  Filled 2014-12-25: qty 2

## 2014-12-25 MED ORDER — LACTATED RINGERS IV SOLN
INTRAVENOUS | Status: DC
  Administered 2014-12-25 (×2): via INTRAVENOUS
  Administered 2014-12-25: 1000 mL via INTRAVENOUS

## 2014-12-25 MED ORDER — MORPHINE SULFATE 2 MG/ML IJ SOLN: 2.0000 mg | INTRAMUSCULAR | Status: DC | PRN

## 2014-12-25 MED ORDER — HEPARIN SODIUM (PORCINE) 1000 UNIT/ML IJ SOLN
INTRAMUSCULAR | Status: AC
  Filled 2014-12-25: qty 1

## 2014-12-25 MED ORDER — LOSARTAN POTASSIUM-HCTZ 100-25 MG PO TABS: 1.0000 | ORAL_TABLET | Freq: Every day | ORAL | Status: DC

## 2014-12-25 MED ORDER — PHENYLEPHRINE HCL 10 MG/ML IJ SOLN
INTRAMUSCULAR | Status: DC | PRN
  Administered 2014-12-25: 80 ug via INTRAVENOUS
  Administered 2014-12-25 (×2): 40 ug via INTRAVENOUS
  Administered 2014-12-25: 120 ug via INTRAVENOUS
  Administered 2014-12-25: 80 ug via INTRAVENOUS
  Administered 2014-12-25: 40 ug via INTRAVENOUS

## 2014-12-25 MED ORDER — CIPROFLOXACIN HCL 500 MG PO TABS: 500.0000 mg | ORAL_TABLET | Freq: Two times a day (BID) | ORAL | Status: DC

## 2014-12-25 MED ORDER — GLYCOPYRROLATE 0.2 MG/ML IJ SOLN
INTRAMUSCULAR | Status: AC
  Filled 2014-12-25: qty 4

## 2014-12-25 MED ORDER — CISATRACURIUM BESYLATE (PF) 10 MG/5ML IV SOLN
INTRAVENOUS | Status: DC | PRN
  Administered 2014-12-25: 4 mg via INTRAVENOUS
  Administered 2014-12-25: 3 mg via INTRAVENOUS
  Administered 2014-12-25: 4 mg via INTRAVENOUS
  Administered 2014-12-25: 5 mg via INTRAVENOUS
  Administered 2014-12-25: 10 mg via INTRAVENOUS
  Administered 2014-12-25: 4 mg via INTRAVENOUS

## 2014-12-25 MED ORDER — SODIUM CHLORIDE 0.9 % IV BOLUS (SEPSIS): 1000.0000 mL | Freq: Once | INTRAVENOUS | Status: DC

## 2014-12-25 MED ORDER — MIDAZOLAM HCL 2 MG/2ML IJ SOLN
INTRAMUSCULAR | Status: AC
  Filled 2014-12-25: qty 2

## 2014-12-25 SURGICAL SUPPLY — 104 items
ADAPTER GOLDBERG URETERAL (ADAPTER) IMPLANT
ADPR CATH 15X14FR FL DRN BG (ADAPTER)
APL ESCP 34 STRL LF DISP (HEMOSTASIS)
APPLICATOR COTTON TIP 6IN STRL (MISCELLANEOUS) IMPLANT
APPLICATOR SURGIFLO ENDO (HEMOSTASIS) IMPLANT
BAG SPEC RTRVL LRG 6X4 10 (ENDOMECHANICALS)
BLADE SURG SZ10 CARB STEEL (BLADE) IMPLANT
CABLE HIGH FREQUENCY MONO STRZ (ELECTRODE) ×4 IMPLANT
CATH FOLEY 2WAY SLVR 18FR 30CC (CATHETERS) ×4 IMPLANT
CATH INTERMIT  6FR 70CM (CATHETERS) ×4 IMPLANT
CATH PEDI SILICON 3C 10FR (CATHETERS) ×4 IMPLANT
CATH ROBINSON RED A/P 16FR (CATHETERS) ×4 IMPLANT
CATH ROBINSON RED A/P 8FR (CATHETERS) ×4 IMPLANT
CATH TIEMANN FOLEY 18FR 5CC (CATHETERS) ×4 IMPLANT
CHLORAPREP W/TINT 26ML (MISCELLANEOUS) ×4 IMPLANT
CLIP LIGATING HEM O LOK PURPLE (MISCELLANEOUS) ×12 IMPLANT
CLOTH BEACON ORANGE TIMEOUT ST (SAFETY) ×4 IMPLANT
CORD HIGH FREQUENCY UNIPOLAR (ELECTROSURGICAL) ×4 IMPLANT
COVER SURGICAL LIGHT HANDLE (MISCELLANEOUS) ×4 IMPLANT
COVER TIP SHEARS 8 DVNC (MISCELLANEOUS) ×3 IMPLANT
COVER TIP SHEARS 8MM DA VINCI (MISCELLANEOUS) ×1
CUTTER ECHEON FLEX ENDO 45 340 (ENDOMECHANICALS) ×4 IMPLANT
DECANTER SPIKE VIAL GLASS SM (MISCELLANEOUS) IMPLANT
DRAIN CHANNEL 15F RND FF 3/16 (WOUND CARE) IMPLANT
DRAPE SURG IRRIG POUCH 19X23 (DRAPES) ×4 IMPLANT
DRSG TEGADERM 4X4.75 (GAUZE/BANDAGES/DRESSINGS) ×4 IMPLANT
DRSG TEGADERM 6X8 (GAUZE/BANDAGES/DRESSINGS) ×12 IMPLANT
ELECT REM PT RETURN 9FT ADLT (ELECTROSURGICAL) ×4
ELECTRODE REM PT RTRN 9FT ADLT (ELECTROSURGICAL) ×3 IMPLANT
GAUZE SPONGE 4X4 12PLY STRL (GAUZE/BANDAGES/DRESSINGS) ×4 IMPLANT
GLOVE BIO SURGEON STRL SZ 6.5 (GLOVE) ×4 IMPLANT
GLOVE BIOGEL M STRL SZ7.5 (GLOVE) ×44 IMPLANT
GOWN STRL REUS W/TWL LRG LVL3 (GOWN DISPOSABLE) ×28 IMPLANT
GUIDEWIRE STR DUAL SENSOR (WIRE) ×4 IMPLANT
HOLDER FOLEY CATH W/STRAP (MISCELLANEOUS) ×4 IMPLANT
IV LACTATED RINGERS 1000ML (IV SOLUTION) IMPLANT
KIT ACCESSORY DA VINCI DISP (KITS) ×1
KIT ACCESSORY DVNC DISP (KITS) ×3 IMPLANT
LEGGING LITHOTOMY PAIR STRL (DRAPES) ×4 IMPLANT
LIQUID BAND (GAUZE/BANDAGES/DRESSINGS) ×4 IMPLANT
MANIFOLD NEPTUNE II (INSTRUMENTS) ×4 IMPLANT
NDL SAFETY ECLIPSE 18X1.5 (NEEDLE) ×3 IMPLANT
NEEDLE HYPO 18GX1.5 SHARP (NEEDLE) ×4
NS IRRIG 1000ML POUR BTL (IV SOLUTION) ×4 IMPLANT
PACK CYSTO (CUSTOM PROCEDURE TRAY) ×4 IMPLANT
PACK ROBOT UROLOGY CUSTOM (CUSTOM PROCEDURE TRAY) ×4 IMPLANT
POSITIONER SURGICAL ARM (MISCELLANEOUS) IMPLANT
POUCH ENDO CATCH II 15MM (MISCELLANEOUS) IMPLANT
POUCH SPECIMEN RETRIEVAL 10MM (ENDOMECHANICALS) IMPLANT
RELOAD GREEN ECHELON 45 (STAPLE) ×4 IMPLANT
RELOAD LINEAR CUT PROX 55 BLUE (ENDOMECHANICALS) IMPLANT
RELOAD STAPLER GOLD 60MM (STAPLE) IMPLANT
RELOAD STAPLER WHITE 60MM (STAPLE) IMPLANT
SET TUBE IRRIG SUCTION NO TIP (IRRIGATION / IRRIGATOR) ×4 IMPLANT
SOLUTION ELECTROLUBE (MISCELLANEOUS) ×4 IMPLANT
SPONGE LAP 18X18 X RAY DECT (DISPOSABLE) IMPLANT
STAPLE ECHEON FLEX 60 POW ENDO (STAPLE) IMPLANT
STAPLER GUN LINEAR PROX 60 (STAPLE) IMPLANT
STAPLER PROXIMATE 55 BLUE (STAPLE) IMPLANT
STAPLER RELOAD GOLD 60MM (STAPLE)
STAPLER RELOAD WHITE 60MM (STAPLE)
STENT CONTOUR 6FRX26X.038 (STENTS) ×4 IMPLANT
STENT CONTOUR 7FRX24X.038 (STENTS) IMPLANT
STENT SINGLE 7F (STENTS) IMPLANT
SURGIFLO W/THROMBIN 8M KIT (HEMOSTASIS) IMPLANT
SUT ETHILON 3 0 PS 1 (SUTURE) ×4 IMPLANT
SUT MNCRL 3 0 RB1 (SUTURE) ×3 IMPLANT
SUT MNCRL 3 0 VIOLET RB1 (SUTURE) ×3 IMPLANT
SUT MNCRL AB 4-0 PS2 18 (SUTURE) ×8 IMPLANT
SUT MON AB 2-0 SH 27 (SUTURE) IMPLANT
SUT MON AB 2-0 SH27 (SUTURE) IMPLANT
SUT MONOCRYL 3 0 RB1 (SUTURE) ×2
SUT PDS AB 1 CTX 36 (SUTURE) IMPLANT
SUT PDS AB 3-0 SH 27 (SUTURE) IMPLANT
SUT PDS AB 4-0 RB1 27 (SUTURE) IMPLANT
SUT PDS AB 4-0 SH 27 (SUTURE) IMPLANT
SUT SILK 0 (SUTURE)
SUT SILK 0 30XBRD TIE 6 (SUTURE) IMPLANT
SUT SILK 2 0 (SUTURE)
SUT SILK 2 0 SH CR/8 (SUTURE) IMPLANT
SUT SILK 2-0 30XBRD TIE 12 (SUTURE) IMPLANT
SUT SILK 3 0 (SUTURE)
SUT SILK 3 0 12 30 (SUTURE) IMPLANT
SUT SILK 3-0 18XBRD TIE 12 (SUTURE) IMPLANT
SUT VIC AB 0 CT1 27 (SUTURE) ×4
SUT VIC AB 0 CT1 27XBRD ANTBC (SUTURE) ×3 IMPLANT
SUT VIC AB 0 UR5 27 (SUTURE) ×4 IMPLANT
SUT VIC AB 2-0 SH 27 (SUTURE) ×8
SUT VIC AB 2-0 SH 27X BRD (SUTURE) ×6 IMPLANT
SUT VIC AB 3-0 SH 27 (SUTURE)
SUT VIC AB 3-0 SH 27X BRD (SUTURE) IMPLANT
SUT VIC AB 4-0 SH 18 (SUTURE) IMPLANT
SUT VICRYL 0 UR6 27IN ABS (SUTURE) ×8 IMPLANT
SUT VLOC BARB 180 ABS3/0GR12 (SUTURE) ×4
SUTURE VLOC BRB 180 ABS3/0GR12 (SUTURE) ×3 IMPLANT
SYR 27GX1/2 1ML LL SAFETY (SYRINGE) ×4 IMPLANT
SYR 3ML LL SCALE MARK (SYRINGE) IMPLANT
SYSTEM UROSTOMY GENTLE TOUCH (WOUND CARE) IMPLANT
TOWEL OR 17X26 10 PK STRL BLUE (TOWEL DISPOSABLE) ×4 IMPLANT
TOWEL OR NON WOVEN STRL DISP B (DISPOSABLE) ×4 IMPLANT
TROCAR XCEL 12X100 BLDLESS (ENDOMECHANICALS) IMPLANT
URINEMETER 200ML W/220 (MISCELLANEOUS) IMPLANT
WATER STERILE IRR 1500ML POUR (IV SOLUTION) ×8 IMPLANT
YANKAUER SUCT BULB TIP 10FT TU (MISCELLANEOUS) IMPLANT

## 2014-12-25 NOTE — Anesthesia Procedure Notes (Signed)
Procedure Name: Intubation Date/Time: 12/25/2014 12:26 PM Performed by: Johnathan Hausen A Pre-anesthesia Checklist: Patient identified, Emergency Drugs available, Suction available, Patient being monitored and Timeout performed Patient Re-evaluated:Patient Re-evaluated prior to inductionOxygen Delivery Method: Circle system utilized Preoxygenation: Pre-oxygenation with 100% oxygen Intubation Type: Combination inhalational/ intravenous induction Ventilation: Mask ventilation without difficulty Tube type: Oral Tube size: 8.0 mm Number of attempts: 1 Airway Equipment and Method: Stylet Placement Confirmation: ETT inserted through vocal cords under direct vision,  positive ETCO2 and breath sounds checked- equal and bilateral Secured at: 22 cm Tube secured with: Tape Dental Injury: Teeth and Oropharynx as per pre-operative assessment

## 2014-12-25 NOTE — Op Note (Signed)
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 Robotic assisted laparoscopic radical prostatectomy (left nerve sparing) 2. Bilateral robotic assisted laparoscopic pelvic lymphadenectomy 3. Bladder diverticulectomy 4. Cystoscopy 5. Left ureteral stent placement ([id number] - removed during the procedure)  Surgeon: Pryor Curia. M.D.  Assistant(s):  Debbrah Alar, PA-C  Resident: Dr. Park Liter  Anesthesia: General  Complications: None  EBL: 50 mL  IVF:  2000 mL crystalloid  Specimens: 1. Prostate and seminal vesicles 2. Right pelvic lymph nodes 3. Left pelvic lymph nodes 4. Bladder diverticulum  Disposition of specimens: Pathology  Drains: 1. 20 Fr coude catheter 2. # 19 Blake pelvic drain  Indication: Anthony Mckinney is a 59 y.o. 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  After a thorough review of the management options for treatment of prostate cancer, he elected to proceed with surgical therapy and the above procedure(s).  We have discussed the potential benefits and risks of the procedure, side effects of the proposed treatment, the likelihood of the patient achieving the goals of the procedure, and any potential problems that might occur during the procedure or recuperation. Informed consent has been obtained.  Description of procedure:  The patient was taken to the operating room and a general anesthetic was administered.  He was given preoperative antibiotics, placed in the dorsal lithotomy position, and prepped and draped in the usual sterile fashion.  Next a preoperative timeout was performed.  Cystourethroscopy was then performed with a 21 French rigid cystoscope  sheath.  This revealed a normal anterior urethra.  The prostatic urethra demonstrated a small median lobe but otherwise without abnormalities.  Inspection of the bladder revealed the ureteral orifices to be in their expected anatomic location and effluxing clear urine.  There was noted to be a large bladder diverticulum located just superior and medial to the left ureteral orifice.  This was a diverticulum noted on his preoperative imaging.  There also was a smaller shallow diverticulum just lateral to the right ureteral orifice.  No other abnormalities were noted including no tumors, stones, or other mucosal pathology.  Attention then turned to the left ureteral orifice.  A 0.38 sensor guidewire was advanced up the left ureter into the renal pelvis under fluoroscopic guidance.  A 6 x 26 double-J ureteral stent was then advanced over the wire using Seldinger technique and positioned appropriately under fluoroscopic and cystoscopic guidance.  The wire was removed.  The stent was noted to be in adequate position.  The patient was then repositioned in Trendelenburg. Another preoperative timeout was performed. A urethral catheter was placed into the bladder and a site was selected near the umbilicus for placement of the camera port. This was placed using a standard open Hassan technique which allowed entry into the peritoneal cavity under direct vision and without difficulty. A 12 mm port was placed and a pneumoperitoneum established. The camera was then used to inspect the abdomen and there was no evidence of any intra-abdominal injuries or other abnormalities. The remaining abdominal ports were then placed. 8 mm robotic ports were placed in the right lower quadrant, left lower quadrant, and far left lateral abdominal wall. A 5 mm port was placed in the right upper quadrant and a 12 mm port was placed in the right lateral abdominal wall for  laparoscopic assistance. All ports were placed under direct vision without  difficulty. The surgical cart was then docked.   Using the cautery scissors, I incised the anterior peritoneum along the left side of the medial umbilical ligament.  This exposed the left side of the bladder and left pelvic sidewall.  This incision in the A[place] and was in carried down posteriorly.  I then incised the posterior peritoneum allowing the colon to be reflected medially.  This allowed exposure of the ureter with the ureteral stent allowing for easy identification.  Dissection then proceeded between the vas deferens and the ureter down to the posterior bladder.  The bladder diverticulum sac was identified and was able to be carefully dissected away from the surrounding structures.  Once it was felt that the diverticulum was adequately mobilized away from the other nearby structures, the bladder was filled with approximately 350 cc of sterile saline.  This confirmed the shape of the diverticulum.  Further dissection then allowed identification of the neck of the diverticulum which was isolated.  The bladder was then entered at the neck of the diverticulum away from the ureter.  Once the diverticulum was removed completely, the bladder was inspected and the ureteral orifice was noted to be nearby but not very close to the diverticular neck.  It was felt that closure of this cystotomy would not involve the ureter and therefore the ureteral stent was carefully removed through the opening in the bladder and taken out through one of the port sites.  The cystotomy was then closed with 2 layers of running 3-0 V lock suture.  The first layer was a mucosal layer followed by a imbricating detrusor layer. The bladder was then filled with 400 cc of sterile saline.  The bladder appeared to be watertight.  Utilizing the cautery scissors, the bladder was reflected posteriorly allowing entry into the space of Retzius and identification of the endopelvic fascia and prostate. The periprostatic fat was then removed from  the prostate allowing full exposure of the endopelvic fascia. The endopelvic fascia was then incised from the apex back to the base of the prostate bilaterally and the underlying levator muscle fibers were swept laterally off the prostate thereby isolating the dorsal venous complex. The dorsal vein was then stapled and divided with a 45 mm Flex Echelon stapler. Attention then turned to the bladder neck which was divided anteriorly thereby allowing entry into the bladder and exposure of the urethral catheter. The catheter balloon was deflated and the catheter was brought into the operative field and used to retract the prostate anteriorly. The posterior bladder neck was then examined and was divided allowing further dissection between the bladder and prostate posteriorly until the vasa deferentia and seminal vessels were identified. The vasa deferentia were isolated, divided, and lifted anteriorly. The seminal vesicles were dissected down to their tips with care to control the seminal vascular arterial blood supply. These structures were then lifted anteriorly and the space between Denonvillier's fascia and the anterior rectum was developed with a combination of sharp and blunt dissection. This isolated the vascular pedicles of the prostate.  The lateral prostatic fascia on the left side of the prostate was then sharply incised allowing release of the neurovascular bundle. The vascular pedicle of the prostate on the left side was then ligated with Weck clips between the prostate and neurovascular bundle and divided with sharp cold scissor dissection resulting in neurovascular bundle preservation. On the right side, a wide non nerve sparing dissection was performed  with Weck clips used to ligate the vascular pedicle of the prostate. The neurovascular bundle on the left side was then separated off the apex of the prostate and urethra.   The urethra was then sharply transected allowing the prostate specimen to be  disarticulated. The pelvis was copiously irrigated and hemostasis was ensured. There was no evidence for rectal injury.  Attention then turned to the right pelvic sidewall. The fibrofatty tissue between the external iliac vein, confluence of the iliac vessels, hypogastric artery, and Cooper's ligament was dissected free from the pelvic sidewall with care to preserve the obturator nerve. Weck clips were used for lymphostasis and hemostasis. An identical procedure was performed on the contralateral side and the lymphatic packets were removed for permanent pathologic analysis.  Attention then turned to the urethral anastomosis. A 2-0 Vicryl slip knot was placed between Denonvillier's fascia, the posterior bladder neck, and the posterior urethra to reapproximate these structures. A double-armed 3-0 Monocryl suture was then used to perform a 360 running tension-free anastomosis between the bladder neck and urethra. A new urethral catheter was then placed into the bladder and irrigated. There were no blood clots within the bladder and the anastomosis appeared to be watertight. A #19 Blake drain was then brought through the left lateral 8 mm port site and positioned appropriately within the pelvis. It was secured to the skin with a nylon suture. The surgical cart was then undocked. The right lateral 12 mm port site was closed at the fascial level with a 0 Vicryl suture placed laparoscopically. All remaining ports were then removed under direct vision. The prostate specimen was removed intact within the Endopouch retrieval bag via the periumbilical camera port site. This fascial opening was closed with two running 0 Vicryl sutures. 0.25% Marcaine was then injected into all port sites and all incisions were reapproximated at the skin level with 4-0 Monocryl subcuticular sutures and Dermabond. The patient appeared to tolerate the procedure well and without complications. The patient was able to be extubated and  transferred to the recovery unit in satisfactory condition.   Pryor Curia MD

## 2014-12-25 NOTE — Discharge Instructions (Signed)

## 2014-12-25 NOTE — Anesthesia Postprocedure Evaluation (Signed)
  Anesthesia Post-op Note  Patient: Anthony Mckinney  Procedure(s) Performed: Procedure(s) (LRB): ROBOTIC ASSISTED LAPAROSCOPIC RADICAL PROSTATECTOMY LEVEL 2 (N/A) LYMPHADENECTOMY (Bilateral) ROBOTIC ASSISTED LAPAROSCOPIC BLADDER DIVERTICULECTOMY (N/A) CYSTOSCOPY WITH STENT PLACEMENT (Left)  Patient Location: PACU  Anesthesia Type: General  Level of Consciousness: awake and alert   Airway and Oxygen Therapy: Patient Spontanous Breathing  Post-op Pain: mild  Post-op Assessment: Post-op Vital signs reviewed, Patient's Cardiovascular Status Stable, Respiratory Function Stable, Patent Airway and No signs of Nausea or vomiting  Last Vitals:  Filed Vitals:   12/25/14 1825  BP: 129/75  Pulse: 83  Temp:   Resp: 16    Post-op Vital Signs: stable   Complications: No apparent anesthesia complications

## 2014-12-25 NOTE — Anesthesia Preprocedure Evaluation (Addendum)
Anesthesia Evaluation  Patient identified by MRN, date of birth, ID band Patient awake    Reviewed: Allergy & Precautions, NPO status , Patient's Chart, lab work & pertinent test results  History of Anesthesia Complications (+) PONV and history of anesthetic complications  Airway Mallampati: II  TM Distance: >3 FB Neck ROM: Full    Dental no notable dental hx. (+) Dental Advisory Given   Pulmonary neg pulmonary ROS,  breath sounds clear to auscultation  Pulmonary exam normal       Cardiovascular hypertension, Pt. on medications Rhythm:Regular Rate:Normal     Neuro/Psych negative neurological ROS  negative psych ROS   GI/Hepatic negative GI ROS, Neg liver ROS,   Endo/Other  negative endocrine ROS  Renal/GU negative Renal ROS  negative genitourinary   Musculoskeletal negative musculoskeletal ROS (+)   Abdominal   Peds negative pediatric ROS (+)  Hematology negative hematology ROS (+) Blood dyscrasia (Factor V Lieden ), ,   Anesthesia Other Findings   Reproductive/Obstetrics negative OB ROS                            Anesthesia Physical Anesthesia Plan  ASA: II  Anesthesia Plan: General   Post-op Pain Management:    Induction: Intravenous  Airway Management Planned: Oral ETT  Additional Equipment:   Intra-op Plan:   Post-operative Plan: Extubation in OR  Informed Consent: I have reviewed the patients History and Physical, chart, labs and discussed the procedure including the risks, benefits and alternatives for the proposed anesthesia with the patient or authorized representative who has indicated his/her understanding and acceptance.   Dental advisory given  Plan Discussed with: CRNA  Anesthesia Plan Comments:         Anesthesia Quick Evaluation

## 2014-12-25 NOTE — Transfer of Care (Signed)
Immediate Anesthesia Transfer of Care Note  Patient: Anthony Mckinney  Procedure(s) Performed: Procedure(s): ROBOTIC ASSISTED LAPAROSCOPIC RADICAL PROSTATECTOMY LEVEL 2 (N/A) LYMPHADENECTOMY (Bilateral) ROBOTIC ASSISTED LAPAROSCOPIC BLADDER DIVERTICULECTOMY (N/A) CYSTOSCOPY WITH STENT PLACEMENT (Left)  Patient Location: PACU  Anesthesia Type:General  Level of Consciousness: sedated  Airway & Oxygen Therapy: Patient Spontanous Breathing and Patient connected to face mask oxygen  Post-op Assessment: Report given to PACU RN and Post -op Vital signs reviewed and stable  Post vital signs: Reviewed and stable  Complications: No apparent anesthesia complications

## 2014-12-26 ENCOUNTER — Encounter (HOSPITAL_COMMUNITY): Payer: Self-pay | Admitting: Urology

## 2014-12-26 LAB — HEMOGLOBIN AND HEMATOCRIT, BLOOD
HCT: 38.2 % — ABNORMAL LOW (ref 39.0–52.0)
Hemoglobin: 13.2 g/dL (ref 13.0–17.0)

## 2014-12-26 LAB — CREATININE, FLUID (PLEURAL, PERITONEAL, JP DRAINAGE): Creat, Fluid: 1.2 mg/dL

## 2014-12-26 MED ORDER — VITAMINS A & D EX OINT
TOPICAL_OINTMENT | CUTANEOUS | Status: AC
Start: 2014-12-26 — End: 2014-12-26
  Administered 2014-12-26: 21:00:00
  Filled 2014-12-26: qty 5

## 2014-12-26 MED ORDER — ENOXAPARIN SODIUM 40 MG/0.4ML ~~LOC~~ SOLN
40.0000 mg | Freq: Once | SUBCUTANEOUS | Status: AC
  Administered 2014-12-26: 40 mg via SUBCUTANEOUS
  Filled 2014-12-26: qty 0.4

## 2014-12-26 MED ORDER — DOCUSATE SODIUM 100 MG PO CAPS
100.0000 mg | ORAL_CAPSULE | Freq: Two times a day (BID) | ORAL | Status: DC
  Administered 2014-12-26 – 2014-12-27 (×3): 100 mg via ORAL
  Filled 2014-12-26 (×4): qty 1

## 2014-12-26 MED ORDER — ENOXAPARIN SODIUM 100 MG/ML ~~LOC~~ SOLN
100.0000 mg | Freq: Two times a day (BID) | SUBCUTANEOUS | Status: DC
Start: 2014-12-26 — End: 2014-12-27
  Administered 2014-12-26 – 2014-12-27 (×2): 100 mg via SUBCUTANEOUS
  Filled 2014-12-26 (×4): qty 1

## 2014-12-26 NOTE — Progress Notes (Signed)
Patient ID: Regis Bill, male   DOB: Jul 23, 1956, 59 y.o.   MRN: [id number]  1 Day Post-Op Subjective: Doing well.  Tolerating liquids.  Passing flatus.  Objective: Vital signs in last 24 hours: Temp:  [96 F (35.6 C)-98.4 F (36.9 C)] 98.4 F (36.9 C) (01/26 0549) Pulse Rate:  [77-102] 91 (01/26 0549) Resp:  [14-20] 18 (01/26 0549) BP: (104-145)/(62-82) 123/73 mmHg (01/26 0549) SpO2:  [94 %-100 %] 97 % (01/26 0549) Weight:  [119.551 kg (263 lb 9 oz)] 119.551 kg (263 lb 9 oz) (01/25 1000)  Intake/Output from previous day: 01/25 0701 - 01/26 0700 In: 4982.9 [P.O.:960; I.V.:4022.9] Out: 1345 [Urine:1135; Drains:160; Blood:50] Intake/Output this shift:    Physical Exam:  General: Alert and oriented CV: RRR Lungs: Clear Abdomen: Soft, ND, Positive BS, NT Incisions: C/D/I Ext: NT, No erythema  Lab Results:  Recent Labs  12/25/14 1716 12/26/14 0510  HGB 13.5 13.2  HCT 39.9 38.2*     Assessment/Plan: 1) Post op care: Ambulate, IS, advance diet, transition to po pain medication 2) Factor V Leiden: Begin prophylactic Lovenox this morning and will increase to therapeutic dose tonight once 24 hours out from surgery.  Follow H/H in am tomorrow. 3) Continue Foley catheter following radical prostatectomy and bladder diverticulectomy. 4) Pathology pending 5) Hope for discharge tomorrow if no concerns about bleeding once anticoagulation restarted.    LOS: 1 day   Jamel Holzmann,LES 12/26/2014, 8:06 AM

## 2014-12-27 LAB — HEMOGLOBIN AND HEMATOCRIT, BLOOD
HCT: 36.1 % — ABNORMAL LOW (ref 39.0–52.0)
Hemoglobin: 12.3 g/dL — ABNORMAL LOW (ref 13.0–17.0)

## 2014-12-27 MED ORDER — HYDROCODONE-ACETAMINOPHEN 5-325 MG PO TABS: 1.0000 | ORAL_TABLET | Freq: Four times a day (QID) | ORAL | Status: DC | PRN

## 2014-12-27 MED ORDER — CIPROFLOXACIN HCL 500 MG PO TABS: 500.0000 mg | ORAL_TABLET | Freq: Two times a day (BID) | ORAL | Status: DC

## 2014-12-27 NOTE — Progress Notes (Addendum)
All discharge information gone over with pt.   Leg bag and foley care teaching done, all questions answered.  Prescriptions given.  VSS.  Pt requested to walk out with wife rather than being wheeled out.

## 2014-12-27 NOTE — Progress Notes (Signed)
Patient ID: Anthony Mckinney, male   DOB: 09/11/1956, 59 y.o.   MRN: [id number]  2 Days Post-Op Subjective: Doing well.  Passing flatus.  Ambulating well.  Pain controlled. Tolerating diet.  Began therapeutic Lovenox last night.  Objective: Vital signs in last 24 hours: Temp:  [98.1 F (36.7 C)-98.3 F (36.8 C)] 98.1 F (36.7 C) (01/27 0426) Pulse Rate:  [74-84] 74 (01/27 0426) Resp:  [18-20] 20 (01/27 0426) BP: (121-124)/(64-72) 124/72 mmHg (01/26 2204) SpO2:  [95 %] 95 % (01/27 0426)  Intake/Output from previous day: 01/26 0701 - 01/27 0700 In: 2290 [P.O.:2290] Out: 2205 [Urine:1850; Drains:355] Intake/Output this shift: Total I/O In: 600 [P.O.:600] Out: 1120 [Urine:950; Drains:170]  Physical Exam:  General: Alert and oriented CV: RRR Lungs: Clear Abdomen: Soft, ND, NT, Normal BS Incisions: C/D/I Ext: NT, No erythema  Lab Results:  Recent Labs  12/25/14 1716 12/26/14 0510  HGB 13.5 13.2  HCT 39.9 38.2*   Drain Cr 1.2  Studies/Results:  Assessment/Plan: - Check H/H this morning.  If stable, will d/c home and continue therapeutic Lovenox dosing.  - D/C drain - D/C home if hemoglobin stable   LOS: 2 days   Altie Savard,LES 12/27/2014, 6:48 AM

## 2014-12-27 NOTE — Discharge Summary (Signed)
  Date of admission: 12/25/2014  Date of discharge: 12/27/2014  Admission diagnosis: Prostate Cancer, bladder diverticulum  Discharge diagnosis: Same as above  Secondary diagnoses: Factor V Leiden  History and Physical: For full details, please see admission history and physical. Briefly, Anthony Mckinney is a 59 y.o. gentleman with localized prostate cancer and a large bladder diverticulum.  After discussing management/treatment options, he elected to proceed with surgical treatment.  Hospital Course: Urbano Milhouse [place] was taken to the operating room on 12/25/2014 and underwent a robotic assisted laparoscopic radical prostatectomy and bladder diverticulectomy. He tolerated this procedure well and without complications. Postoperatively, he was able to be transferred to a regular hospital room following recovery from anesthesia.  He was able to begin ambulating the night of surgery. He remained hemodynamically stable overnight.  He had excellent urine output with appropriately minimal output from his pelvic drain.  He was transitioned to oral pain medication, tolerated a clear liquid diet which was gradually advanced, and restarted anticoagulation with Lovenox.  He Hgb remained stable and his drain Cr was consistent with serum.  He had met all discharge criteria and was able to be discharged home later on POD#2.  Laboratory values:  Recent Labs  12/25/14 1716 12/26/14 0510  HGB 13.5 13.2  HCT 39.9 38.2*    Disposition: Home  Discharge instruction: He was instructed to be ambulatory but to refrain from heavy lifting, strenuous activity, or driving. He was instructed on urethral catheter care.  Discharge medications:     Medication List    STOP taking these medications        celecoxib 200 MG capsule  Commonly known as:  CELEBREX     multivitamin with minerals Tabs tablet     phentermine 37.5 MG tablet  Commonly known as:  ADIPEX-P     tamsulosin 0.4 MG Caps capsule  Commonly known as:   FLOMAX     warfarin 10 MG tablet  Commonly known as:  COUMADIN     warfarin 6 MG tablet  Commonly known as:  COUMADIN      TAKE these medications        ciprofloxacin 500 MG tablet  Commonly known as:  CIPRO  Take 1 tablet (500 mg total) by mouth 2 (two) times daily. Start day prior to office visit for foley removal     enoxaparin 100 MG/ML injection  Commonly known as:  LOVENOX  Inject 100 mg into the skin every 12 (twelve) hours.     HYDROcodone-acetaminophen 5-325 MG per tablet  Commonly known as:  NORCO  Take 1-2 tablets by mouth every 6 (six) hours as needed.     losartan-hydrochlorothiazide 100-25 MG per tablet  Commonly known as:  HYZAAR  Take 1 tablet by mouth daily.        He will restart his Coumadin on Sunday night and have his INR checked at his PCP's office on next Thursday.  Followup: He will followup in 1 1/2 week for catheter removal and to discuss his surgical pathology results.

## 2015-08-21 ENCOUNTER — Other Ambulatory Visit: Payer: Self-pay | Admitting: Gastroenterology

## 2015-10-19 ENCOUNTER — Other Ambulatory Visit: Payer: Self-pay | Admitting: Surgery

## 2015-10-19 NOTE — H&P (Signed)
Anthony Mckinney  Location: [facility] Patient #: [id number] DOB: 12-21-1955 Married / Language: English / Race: White Male   History of Present Illness [name] MD; 04/23/2015 3:36 PM) Patient words: [facility].  The patient is a 59 year old male who presents with an inguinal hernia. Patient comes in today for concern of RIGHT greater than LEFT inguinal hernias. He is noted some swelling in his groin suspicion for hernia for many years. They've mildly gotten larger. He's had some episodes were to become sensitive or painful especially with exercise. He walks several miles without difficulty. Normally has a bowel movement every day or so. Diagnosed with prostate cancer. Underwent Robotic prostatectomy earlier this year. Resection of bladder diverticulum. Recovered from that. He has a factor V Leiden deficiency = on chronic warfarin anticoagulation. He tolerated the Lovenox bridge perioperatively. He wishes to be aggressive and have the hernias addressed.     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 Losartan Potassium-HCTZ (100-25MG  Tablet, Oral) Active. Phentermine HCl (37.5MG  Tablet, Oral) Active. Hydrocodone-Acetaminophen (5-325MG  Tablet, Oral) Active. Ciprofloxacin HCl (500MG  Tablet, Oral) Active. Enoxaparin Sodium (100MG /ML Solution,  Subcutaneous) Active. Tamsulosin HCl (0.4MG  Capsule, Oral) Active. Medications Reconciled  Social History Elbert Ewings, [place]; 04/23/2015 8:49 AM) Caffeine use Carbonated beverages. No alcohol use No drug use Tobacco use Never smoker.  Family History Elbert Ewings, [place]; 04/23/2015 8:49 AM) Hypertension Father. Respiratory Condition Father. Thyroid problems Mother.    Review of Systems Elbert Ewings CMA; 04/23/2015 8:49 AM) General Not Present- Appetite Loss, Chills, Fatigue, Fever, Night Sweats, Weight Gain and Weight Loss. Skin Not Present- Change in Wart/Mole, Dryness, Hives, Jaundice, New Lesions, Non-Healing Wounds, Rash and Ulcer. HEENT Present- Hearing Loss, Ringing in the Ears and Wears glasses/contact lenses. Not Present- Earache, Hoarseness, Nose Bleed, Oral Ulcers, Seasonal Allergies, Sinus Pain, Sore Throat, Visual Disturbances and Yellow Eyes. Respiratory Not Present- Bloody sputum, Chronic Cough, Difficulty Breathing, Snoring and Wheezing. Breast Not Present- Breast Mass, Breast Pain, Nipple Discharge and Skin Changes. Cardiovascular Present- Swelling of Extremities. Not Present- Chest Pain, Difficulty Breathing Lying Down, Leg Cramps, Palpitations, Rapid Heart Rate and Shortness of Breath. Gastrointestinal Present- Constipation. Not Present- Abdominal Pain, Bloating, Bloody Stool, Change in Bowel Habits, Chronic diarrhea, Difficulty Swallowing, Excessive gas, Gets full quickly at meals, Hemorrhoids, Indigestion, Nausea, Rectal Pain and Vomiting. Male Genitourinary Present- Impotence. Not Present- Blood in Urine, Change in Urinary Stream, Frequency, Nocturia, Painful Urination, Urgency and Urine Leakage. Musculoskeletal Present- Back Pain and Joint Pain. Not Present- Joint Stiffness, Muscle Pain, Muscle Weakness and Swelling of Extremities. Neurological Not Present- Decreased Memory, Fainting, Headaches, Numbness, Seizures, Tingling, Tremor, Trouble walking and  Weakness. Psychiatric Not Present- Anxiety, Bipolar, Change in Sleep Pattern, Depression, Fearful and Frequent crying. Endocrine Not Present- Cold Intolerance, Excessive Hunger, Hair Changes, Heat Intolerance, Hot flashes and New Diabetes. Hematology Not Present- Easy Bruising, Excessive bleeding, Gland problems, HIV and Persistent Infections.  Vitals Elbert Ewings CMA; 04/23/2015 8:50 AM) 04/23/2015 8:50 AM Weight: 274.6 lb Height: 73in  Body Surface Area: 2.53 m Body Mass Index: 36.23 kg/m  Temp.: 97.69F(Oral)  Pulse: 90 (Regular)  Resp.: 19 (Unlabored)  BP: 130/70 (Sitting, Left Arm, Standard)     Physical Exam [name] MD; 04/23/2015 3:36 PM) General Mental Status-Alert. General Appearance-Not in acute distress, Not Sickly. Orientation-Oriented X3. Hydration-Well hydrated. Voice-Normal.  Integumentary Global Assessment Upon inspection and palpation of skin surfaces of the - Axillae: non-tender, no inflammation or ulceration, no drainage. and Distribution of scalp and body hair is normal. General Characteristics Temperature - normal warmth is noted.  Head and Neck Head-normocephalic, atraumatic with no lesions or palpable masses. Face Global Assessment - atraumatic, no absence of expression. Neck Global Assessment - no abnormal movements, no bruit auscultated on the right, no bruit auscultated on the left, no decreased range of motion, non-tender. Trachea-midline. Thyroid Gland Characteristics - non-tender.  Eye Eyeball - Left-Extraocular movements intact, No Nystagmus. Eyeball - Right-Extraocular movements intact, No Nystagmus. Cornea - Left-No Hazy. Cornea - Right-No Hazy. Sclera/Conjunctiva - Left-No scleral icterus, No Discharge. Sclera/Conjunctiva - Right-No scleral icterus, No Discharge. Pupil - Left-Direct reaction to light normal. Pupil - Right-Direct reaction to light normal.  ENMT Ears Pinna - Left - no  drainage observed, no generalized tenderness observed. Right - no drainage observed, no generalized tenderness observed. Nose and Sinuses External Inspection of the Nose - no destructive lesion observed. Inspection of the nares - Left - quiet respiration. Right - quiet respiration. Mouth and Throat Lips - Upper Lip - no fissures observed, no pallor noted. Lower Lip - no fissures observed, no pallor noted. Nasopharynx - no discharge present. Oral Cavity/Oropharynx - Tongue - no dryness observed. Oral Mucosa - no cyanosis observed. Hypopharynx - no evidence of airway distress observed.  Chest and Lung Exam Inspection Movements - Normal and Symmetrical. Accessory muscles - No use of accessory muscles in breathing. Palpation Palpation of the chest reveals - Non-tender. Auscultation Breath sounds - Normal and Clear.  Cardiovascular Auscultation Rhythm - Regular. Murmurs & Other Heart Sounds - Auscultation of the heart reveals - No Murmurs and No Systolic Clicks.  Abdomen Inspection Inspection of the abdomen reveals - No Visible peristalsis and No Abnormal pulsations. Umbilicus - No Bleeding, No Urine drainage. Palpation/Percussion Palpation and Percussion of the abdomen reveal - Soft, Non Tender, No Rebound tenderness, No Rigidity (guarding) and No Cutaneous hyperesthesia. Note: robotic port site healed   Male Genitourinary Sexual Maturity Tanner 5 - Adult hair pattern and Adult penile size and shape. Note: Normal external male genitalia circumcised testes bilaterally. Mildly atrophic. Obvious RIGHT groin inguinal hernia partially reducible. Obvious bulge in LEFT groin suspicious for direct inguinal hernia.   Peripheral Vascular Upper Extremity Inspection - Left - No Cyanotic nailbeds, Not Ischemic. Right - No Cyanotic nailbeds, Not Ischemic.  Neurologic Neurologic evaluation reveals -normal attention span and ability to concentrate, able to name objects and repeat phrases.  Appropriate fund of knowledge , normal sensation and normal coordination. Mental Status Affect - not angry, not paranoid. Cranial Nerves-Normal Bilaterally. Gait-Normal.  Neuropsychiatric Mental status exam performed with findings of-able to articulate well with normal speech/language, rate, volume and coherence, thought content normal with ability to perform basic computations and apply abstract reasoning and no evidence of hallucinations, delusions, obsessions or homicidal/suicidal ideation.  Musculoskeletal Global Assessment Spine, Ribs and Pelvis - no instability, subluxation or laxity. Right Upper Extremity - no instability, subluxation or laxity.  Lymphatic Head & Neck  General Head & Neck Lymphatics: Bilateral - Description - No Localized  lymphadenopathy. Axillary  General Axillary Region: Bilateral - Description - No Localized lymphadenopathy. Femoral & Inguinal  Generalized Femoral & Inguinal Lymphatics: Left - Description - No Localized lymphadenopathy. Right - Description - No Localized lymphadenopathy.    Assessment & Plan [name] MD; 04/23/2015 9:24 AM) BILATERAL INGUINAL HERNIA WITHOUT OBSTRUCTION OR GANGRENE, RECURRENCE NOT SPECIFIED (550.92  K40.20) Current Plans Schedule for Surgery  Written instructions provided  Discussed regular exercise with patient.  The anatomy & physiology of the abdominal wall and pelvic floor was discussed.  The pathophysiology of hernias in the inguinal and pelvic region was discussed.  Natural history risks such as progressive enlargement, pain, incarceration, and strangulation was discussed.   Contributors to complications such as smoking, obesity, diabetes, prior surgery, etc were discussed.    I feel the risks of no intervention will lead to serious problems that outweigh the operative risks; therefore, I recommended surgery to reduce and repair the hernia.  I explained laparoscopic techniques with possible need for  an open approach.  I noted usual use of mesh to patch and/or buttress hernia repair  Risks such as bleeding, infection, abscess, need for further treatment, injury to other organs, need for repair of tissues / organs, stroke, heart attack, death, and other risks were discussed.  I noted a good likelihood this will help address the problem.   Goals of post-operative recovery were discussed as well.  Possibility that this will not correct all symptoms was explained.  I stressed the importance of low-impact activity, aggressive pain control, avoiding constipation, & not pushing through pain to minimize risk of post-operative chronic pain or injury. Possibility of reherniation was discussed.  We will work to minimize complications.     An educational handout further explaining the pathology & treatment options was given as well.  Questions were answered.  The patient expresses understanding & wishes to proceed with surgery.  Pt ready for surgery now - held off.  Cleared by cardiology w Lovenox bridge considered.  Completed surgical orders turned into scheduling tray. Received clearance by DR Harrington Challenger with Lovenox bridging instructions. April with Dr Harrington Challenger will go over the Lovenox bridging instructions with the pt for sx in November 2016.  Pt Education - [facility] Hernia Post-Op HCI (Haidan Nhan): discussed with patient and provided information. Pt Education - [facility] Pain Control (Ashely Joshua) Pt Education - [facility] Pelvic Floor Exercises (Kegels) and Dysfunction HCI (Tiarna Koppen)  [name], M.D., F.A.C.S. Gastrointestinal and Minimally Invasive Surgery [facility] [address] [place], [place] [address] [phone] Main / Paging

## 2015-10-19 NOTE — Progress Notes (Signed)
Pt is not followed by [facility] heart care for INR

## 2015-10-23 ENCOUNTER — Encounter (HOSPITAL_COMMUNITY): Payer: Self-pay

## 2015-10-23 ENCOUNTER — Encounter (HOSPITAL_COMMUNITY)
Admission: RE | Admit: 2015-10-23 | Discharge: 2015-10-23 | Disposition: A | Discharge status: Home / Self Care | Payer: BLUE CROSS/BLUE SHIELD | Source: Ambulatory Visit | Attending: Surgery | Admitting: Surgery

## 2015-10-23 DIAGNOSIS — K4020 Bilateral inguinal hernia, without obstruction or gangrene, not specified as recurrent: Secondary | ICD-10-CM | POA: Diagnosis not present

## 2015-10-23 DIAGNOSIS — Z01818 Encounter for other preprocedural examination: Secondary | ICD-10-CM | POA: Insufficient documentation

## 2015-10-23 HISTORY — DX: Nocturia: R35.1

## 2015-10-23 HISTORY — DX: Urinary tract infection, site not specified: N39.0

## 2015-10-23 HISTORY — DX: Tinnitus, unspecified ear: H93.19

## 2015-10-23 HISTORY — DX: Pneumonia, unspecified organism: J18.9

## 2015-10-23 HISTORY — DX: Unspecified hearing loss, unspecified ear: H91.90

## 2015-10-23 LAB — CBC
HCT: 42.5 % (ref 39.0–52.0)
HEMOGLOBIN: 14.7 g/dL (ref 13.0–17.0)
MCH: 31.7 pg (ref 26.0–34.0)
MCHC: 34.6 g/dL (ref 30.0–36.0)
MCV: 91.6 fL (ref 78.0–100.0)
PLATELETS: 258 10*3/uL (ref 150–400)
RBC: 4.64 MIL/uL (ref 4.22–5.81)
RDW: 13.6 % (ref 11.5–15.5)
WBC: 6 10*3/uL (ref 4.0–10.5)

## 2015-10-23 LAB — BASIC METABOLIC PANEL
Anion gap: 8 (ref 5–15)
BUN: 18 mg/dL (ref 6–20)
CHLORIDE: 96 mmol/L — AB (ref 101–111)
CO2: 28 mmol/L (ref 22–32)
CREATININE: 1.13 mg/dL (ref 0.61–1.24)
Calcium: 9.4 mg/dL (ref 8.9–10.3)
GFR calc non Af Amer: >60 mL/min (ref >60)
Glucose, Bld: 83 mg/dL (ref 65–99)
Potassium: 4.3 mmol/L (ref 3.5–5.1)
Sodium: 132 mmol/L — ABNORMAL LOW (ref 135–145)

## 2015-10-23 NOTE — Patient Instructions (Signed)
Anthony Mckinney  10/23/2015   Your procedure is scheduled on:  Wednesday October 31, 2015  Report to [facility] Main  Entrance take [place]  elevators to 3rd floor to  [place] at 5:00 AM.  Call this number if you have problems the morning of surgery [phone]   Remember: ONLY 1 PERSON MAY GO WITH YOU TO SHORT STAY TO GET  READY MORNING OF [place].  Do not eat food or drink liquids :After Midnight.     Take these medicines the morning of surgery with A SIP OF WATER: NONE                               You may not have any metal on your body including hair pins and              piercings  Do not wear jewelry,  lotions, powders or colognes, deodorant                           Men may shave face and neck.   Do not bring valuables to the hospital. [place].  Contacts, dentures or bridgework may not be worn into surgery.      Patients discharged the day of surgery will not be allowed to drive home.  Name and phone number of your driver:Anthony Mckinney (wife)  _____________________________________________________________________             [facility] - Preparing for Surgery Before surgery, you can play an important role.  Because skin is not sterile, your skin needs to be as free of germs as possible.  You can reduce the number of germs on your skin by washing with CHG (chlorahexidine gluconate) soap before surgery.  CHG is an antiseptic cleaner which kills germs and bonds with the skin to continue killing germs even after washing. Please DO NOT use if you have an allergy to CHG or antibacterial soaps.  If your skin becomes reddened/irritated stop using the CHG and inform your nurse when you arrive at Short Stay. Do not shave (including legs and underarms) for at least 48 hours prior to the first CHG shower.  You may shave your face/neck. Please follow these instructions carefully:  1.  Shower  with CHG Soap the night before surgery and the  morning of Surgery.  2.  If you choose to wash your hair, wash your hair first as usual with your  normal  shampoo.  3.  After you shampoo, rinse your hair and body thoroughly to remove the  shampoo.                           4.  Use CHG as you would any other liquid soap.  You can apply chg directly  to the skin and wash                       Gently with a scrungie or clean washcloth.  5.  Apply the CHG Soap to your body ONLY FROM THE NECK DOWN.   Do not use on face/ open  Wound or open sores. Avoid contact with eyes, ears mouth and genitals (private parts).                       Wash face,  Genitals (private parts) with your normal soap.             6.  Wash thoroughly, paying special attention to the area where your surgery  will be performed.  7.  Thoroughly rinse your body with warm water from the neck down.  8.  DO NOT shower/wash with your normal soap after using and rinsing off  the CHG Soap.                9.  Pat yourself dry with a clean towel.            10.  Wear clean pajamas.            11.  Place clean sheets on your bed the night of your first shower and do not  sleep with pets. Day of Surgery : Do not apply any lotions/deodorants the morning of surgery.  Please wear clean clothes to the hospital/surgery center.  FAILURE TO FOLLOW THESE INSTRUCTIONS MAY RESULT IN THE CANCELLATION OF YOUR SURGERY PATIENT SIGNATURE_________________________________  NURSE SIGNATURE__________________________________  ________________________________________________________________________

## 2015-10-23 NOTE — Progress Notes (Addendum)
Spoke with Dr Conrad Woodside / anesthesia in regards to pts H&P and clarification if pt needs to continue lovenox morning of surgery. Dr Conrad Napoleon requested for question to be directed to Dr Johney Maine. This nurse clarified with Dr Johney Maine pt to hold lovenox morning of surgery. Pt also aware to discontinue Phentremine till after surgery. Dr Conrad Sargent aware pt has taken through today 10/23/2015.  EKG epic 12/19/2014 CXR epic 12/19/2014

## 2015-10-30 MED ORDER — DEXTROSE 5 % IV SOLN
3.0000 g | INTRAVENOUS | Status: AC
  Administered 2015-10-31: 3 g via INTRAVENOUS
  Filled 2015-10-30 (×2): qty 3000

## 2015-10-31 ENCOUNTER — Ambulatory Visit (HOSPITAL_COMMUNITY): Payer: BLUE CROSS/BLUE SHIELD | Admitting: Anesthesiology

## 2015-10-31 ENCOUNTER — Encounter (HOSPITAL_COMMUNITY)
Admission: RE | Disposition: A | Discharge status: Home / Self Care | Payer: Self-pay | Source: Ambulatory Visit | Attending: Surgery

## 2015-10-31 ENCOUNTER — Ambulatory Visit (HOSPITAL_COMMUNITY)
Admission: RE | Admit: 2015-10-31 | Discharge: 2015-10-31 | Disposition: A | Discharge status: Home / Self Care | Payer: BLUE CROSS/BLUE SHIELD | Source: Ambulatory Visit | Attending: Surgery | Admitting: Surgery

## 2015-10-31 ENCOUNTER — Encounter (HOSPITAL_COMMUNITY): Payer: Self-pay | Admitting: *Deleted

## 2015-10-31 DIAGNOSIS — K429 Umbilical hernia without obstruction or gangrene: Secondary | ICD-10-CM | POA: Insufficient documentation

## 2015-10-31 DIAGNOSIS — Z8546 Personal history of malignant neoplasm of prostate: Secondary | ICD-10-CM | POA: Insufficient documentation

## 2015-10-31 DIAGNOSIS — Z86711 Personal history of pulmonary embolism: Secondary | ICD-10-CM | POA: Diagnosis not present

## 2015-10-31 DIAGNOSIS — D176 Benign lipomatous neoplasm of spermatic cord: Secondary | ICD-10-CM | POA: Diagnosis not present

## 2015-10-31 DIAGNOSIS — Z7901 Long term (current) use of anticoagulants: Secondary | ICD-10-CM | POA: Diagnosis not present

## 2015-10-31 DIAGNOSIS — Z86718 Personal history of other venous thrombosis and embolism: Secondary | ICD-10-CM | POA: Diagnosis not present

## 2015-10-31 DIAGNOSIS — D6851 Activated protein C resistance: Secondary | ICD-10-CM | POA: Diagnosis not present

## 2015-10-31 DIAGNOSIS — K4020 Bilateral inguinal hernia, without obstruction or gangrene, not specified as recurrent: Secondary | ICD-10-CM | POA: Insufficient documentation

## 2015-10-31 DIAGNOSIS — I10 Essential (primary) hypertension: Secondary | ICD-10-CM | POA: Diagnosis not present

## 2015-10-31 DIAGNOSIS — K4190 Unilateral femoral hernia, without obstruction or gangrene, not specified as recurrent: Secondary | ICD-10-CM | POA: Diagnosis not present

## 2015-10-31 DIAGNOSIS — Z79899 Other long term (current) drug therapy: Secondary | ICD-10-CM | POA: Insufficient documentation

## 2015-10-31 HISTORY — PX: INGUINAL HERNIA REPAIR: SHX194

## 2015-10-31 HISTORY — PX: UMBILICAL HERNIA REPAIR: SHX196

## 2015-10-31 LAB — PROTIME-INR
INR: 0.95 (ref 0.00–1.49)
PROTHROMBIN TIME: 12.9 s (ref 11.6–15.2)

## 2015-10-31 LAB — APTT: aPTT: 30 seconds (ref 24–37)

## 2015-10-31 SURGERY — REPAIR, HERNIA, INGUINAL, BILATERAL, LAPAROSCOPIC
Anesthesia: General

## 2015-10-31 MED ORDER — LACTATED RINGERS IR SOLN
Status: DC | PRN
  Administered 2015-10-31: 1000 mL

## 2015-10-31 MED ORDER — BUPIVACAINE-EPINEPHRINE 0.25% -1:200000 IJ SOLN
INTRAMUSCULAR | Status: DC | PRN
  Administered 2015-10-31: 100 mL

## 2015-10-31 MED ORDER — BUPIVACAINE-EPINEPHRINE (PF) 0.25% -1:200000 IJ SOLN
INTRAMUSCULAR | Status: AC
Start: 2015-10-31 — End: 2015-10-31
  Filled 2015-10-31: qty 30

## 2015-10-31 MED ORDER — GLYCOPYRROLATE 0.2 MG/ML IJ SOLN
INTRAMUSCULAR | Status: DC | PRN
  Administered 2015-10-31: .6 mg via INTRAVENOUS

## 2015-10-31 MED ORDER — LIDOCAINE HCL (CARDIAC) 20 MG/ML IV SOLN
INTRAVENOUS | Status: DC | PRN
  Administered 2015-10-31: 50 mg via INTRAVENOUS

## 2015-10-31 MED ORDER — NEOSTIGMINE METHYLSULFATE 10 MG/10ML IV SOLN
INTRAVENOUS | Status: DC | PRN
  Administered 2015-10-31: 4 mg via INTRAVENOUS

## 2015-10-31 MED ORDER — EPHEDRINE SULFATE 50 MG/ML IJ SOLN
INTRAMUSCULAR | Status: DC | PRN
  Administered 2015-10-31 (×3): 10 mg via INTRAVENOUS

## 2015-10-31 MED ORDER — CHLORHEXIDINE GLUCONATE 4 % EX LIQD: 1.0000 "application " | Freq: Once | CUTANEOUS | Status: DC

## 2015-10-31 MED ORDER — FENTANYL CITRATE (PF) 250 MCG/5ML IJ SOLN
INTRAMUSCULAR | Status: AC
  Filled 2015-10-31: qty 5

## 2015-10-31 MED ORDER — NEOSTIGMINE METHYLSULFATE 10 MG/10ML IV SOLN
INTRAVENOUS | Status: AC
  Filled 2015-10-31: qty 1

## 2015-10-31 MED ORDER — ROCURONIUM BROMIDE 100 MG/10ML IV SOLN
INTRAVENOUS | Status: DC | PRN
  Administered 2015-10-31: 10 mg via INTRAVENOUS
  Administered 2015-10-31: 30 mg via INTRAVENOUS
  Administered 2015-10-31: 20 mg via INTRAVENOUS

## 2015-10-31 MED ORDER — PHENYLEPHRINE HCL 10 MG/ML IJ SOLN
INTRAMUSCULAR | Status: DC | PRN
  Administered 2015-10-31 (×4): 80 ug via INTRAVENOUS

## 2015-10-31 MED ORDER — 0.9 % SODIUM CHLORIDE (POUR BTL) OPTIME
TOPICAL | Status: DC | PRN
  Administered 2015-10-31: 1000 mL

## 2015-10-31 MED ORDER — PROMETHAZINE HCL 25 MG/ML IJ SOLN: 6.2500 mg | INTRAMUSCULAR | Status: DC | PRN

## 2015-10-31 MED ORDER — ONDANSETRON HCL 4 MG/2ML IJ SOLN
INTRAMUSCULAR | Status: DC | PRN
  Administered 2015-10-31: 4 mg via INTRAVENOUS

## 2015-10-31 MED ORDER — BUPIVACAINE-EPINEPHRINE 0.25% -1:200000 IJ SOLN
INTRAMUSCULAR | Status: AC
  Filled 2015-10-31: qty 2

## 2015-10-31 MED ORDER — OXYCODONE HCL 5 MG PO TABS
5.0000 mg | ORAL_TABLET | ORAL | Status: DC | PRN
  Administered 2015-10-31: 5 mg via ORAL
  Filled 2015-10-31: qty 1

## 2015-10-31 MED ORDER — SUCCINYLCHOLINE CHLORIDE 20 MG/ML IJ SOLN
INTRAMUSCULAR | Status: DC | PRN
  Administered 2015-10-31: 100 mg via INTRAVENOUS
  Administered 2015-10-31: 30 mg via INTRAVENOUS

## 2015-10-31 MED ORDER — OXYCODONE HCL 5 MG PO TABS: 5.0000 mg | ORAL_TABLET | ORAL | Status: DC | PRN

## 2015-10-31 MED ORDER — MIDAZOLAM HCL 5 MG/5ML IJ SOLN
INTRAMUSCULAR | Status: DC | PRN
  Administered 2015-10-31: 2 mg via INTRAVENOUS

## 2015-10-31 MED ORDER — PROPOFOL 10 MG/ML IV BOLUS
INTRAVENOUS | Status: DC | PRN
  Administered 2015-10-31: 200 mg via INTRAVENOUS

## 2015-10-31 MED ORDER — STERILE WATER FOR IRRIGATION IR SOLN
Status: DC | PRN
  Administered 2015-10-31: 2000 mL

## 2015-10-31 MED ORDER — LACTATED RINGERS IV SOLN
INTRAVENOUS | Status: DC | PRN
  Administered 2015-10-31 (×2): via INTRAVENOUS

## 2015-10-31 MED ORDER — ROCURONIUM BROMIDE 100 MG/10ML IV SOLN
INTRAVENOUS | Status: AC
  Filled 2015-10-31: qty 1

## 2015-10-31 MED ORDER — FENTANYL CITRATE (PF) 100 MCG/2ML IJ SOLN
INTRAMUSCULAR | Status: DC | PRN
Start: 2015-10-31 — End: 2015-10-31
  Administered 2015-10-31: 100 ug via INTRAVENOUS
  Administered 2015-10-31 (×3): 50 ug via INTRAVENOUS

## 2015-10-31 MED ORDER — LIDOCAINE HCL (CARDIAC) 20 MG/ML IV SOLN
INTRAVENOUS | Status: AC
  Filled 2015-10-31: qty 5

## 2015-10-31 MED ORDER — WARFARIN SODIUM 6 MG PO TABS: 9.0000 mg | ORAL_TABLET | ORAL | Status: DC

## 2015-10-31 MED ORDER — WARFARIN SODIUM 10 MG PO TABS: 10.0000 mg | ORAL_TABLET | ORAL | Status: AC

## 2015-10-31 MED ORDER — MIDAZOLAM HCL 2 MG/2ML IJ SOLN
INTRAMUSCULAR | Status: AC
Start: 2015-10-31 — End: 2015-10-31
  Filled 2015-10-31: qty 2

## 2015-10-31 MED ORDER — PHENYLEPHRINE 40 MCG/ML (10ML) SYRINGE FOR IV PUSH (FOR BLOOD PRESSURE SUPPORT)
PREFILLED_SYRINGE | INTRAVENOUS | Status: AC
  Filled 2015-10-31: qty 10

## 2015-10-31 MED ORDER — FENTANYL CITRATE (PF) 100 MCG/2ML IJ SOLN
INTRAMUSCULAR | Status: AC
  Filled 2015-10-31: qty 2

## 2015-10-31 MED ORDER — SODIUM CHLORIDE 0.9 % IJ SOLN
INTRAMUSCULAR | Status: AC
  Filled 2015-10-31: qty 10

## 2015-10-31 MED ORDER — EPHEDRINE SULFATE 50 MG/ML IJ SOLN
INTRAMUSCULAR | Status: AC
  Filled 2015-10-31: qty 1

## 2015-10-31 MED ORDER — FENTANYL CITRATE (PF) 100 MCG/2ML IJ SOLN
25.0000 ug | INTRAMUSCULAR | Status: DC | PRN
  Administered 2015-10-31 (×2): 50 ug via INTRAVENOUS

## 2015-10-31 MED ORDER — PROPOFOL 10 MG/ML IV BOLUS
INTRAVENOUS | Status: AC
  Filled 2015-10-31: qty 20

## 2015-10-31 MED ORDER — OXYCODONE HCL 5 MG PO TABS
5.0000 mg | ORAL_TABLET | Freq: Once | ORAL | Status: AC
  Administered 2015-10-31: 5 mg via ORAL
  Filled 2015-10-31: qty 1

## 2015-10-31 MED ORDER — GLYCOPYRROLATE 0.2 MG/ML IJ SOLN
INTRAMUSCULAR | Status: AC
  Filled 2015-10-31: qty 3

## 2015-10-31 MED ORDER — ONDANSETRON HCL 4 MG/2ML IJ SOLN
INTRAMUSCULAR | Status: AC
  Filled 2015-10-31: qty 2

## 2015-10-31 SURGICAL SUPPLY — 32 items
CABLE HIGH FREQUENCY MONO STRZ (ELECTRODE) ×4 IMPLANT
CHLORAPREP W/TINT 26ML (MISCELLANEOUS) ×4 IMPLANT
COVER SURGICAL LIGHT HANDLE (MISCELLANEOUS) IMPLANT
DECANTER SPIKE VIAL GLASS SM (MISCELLANEOUS) ×4 IMPLANT
DEVICE SECURE STRAP 25 ABSORB (INSTRUMENTS) IMPLANT
DRAPE LAPAROSCOPIC ABDOMINAL (DRAPES) ×4 IMPLANT
DRAPE UTILITY XL STRL (DRAPES) ×4 IMPLANT
DRAPE WARM FLUID 44X44 (DRAPE) ×4 IMPLANT
DRSG TEGADERM 2-3/8X2-3/4 SM (GAUZE/BANDAGES/DRESSINGS) ×4 IMPLANT
DRSG TEGADERM 4X4.75 (GAUZE/BANDAGES/DRESSINGS) ×4 IMPLANT
ELECT REM PT RETURN 9FT ADLT (ELECTROSURGICAL) ×4
ELECTRODE REM PT RTRN 9FT ADLT (ELECTROSURGICAL) ×2 IMPLANT
GAUZE SPONGE 2X2 8PLY STRL LF (GAUZE/BANDAGES/DRESSINGS) ×2 IMPLANT
GLOVE ECLIPSE 8.0 STRL XLNG CF (GLOVE) ×4 IMPLANT
GLOVE INDICATOR 8.0 STRL GRN (GLOVE) ×4 IMPLANT
GOWN STRL REUS W/TWL XL LVL3 (GOWN DISPOSABLE) ×12 IMPLANT
HOVERMATT SINGLE USE (MISCELLANEOUS) ×4 IMPLANT
KIT BASIN OR (CUSTOM PROCEDURE TRAY) ×4 IMPLANT
MESH ULTRAPRO 6X6 15CM15CM (Mesh General) ×12 IMPLANT
SCISSORS LAP 5X35 DISP (ENDOMECHANICALS) ×4 IMPLANT
SET IRRIG TUBING LAPAROSCOPIC (IRRIGATION / IRRIGATOR) ×4 IMPLANT
SLEEVE XCEL OPT CAN 5 100 (ENDOMECHANICALS) ×4 IMPLANT
SPONGE GAUZE 2X2 STER 10/PKG (GAUZE/BANDAGES/DRESSINGS) ×2
SUT MNCRL AB 4-0 PS2 18 (SUTURE) ×4 IMPLANT
SUT PDS AB 1 CT1 27 (SUTURE) ×8 IMPLANT
SUT VIC AB 3-0 SH 27 (SUTURE) ×6
SUT VIC AB 3-0 SH 27XBRD (SUTURE) ×4 IMPLANT
TACKER 5MM HERNIA 3.5CML NAB (ENDOMECHANICALS) IMPLANT
TOWEL OR 17X26 10 PK STRL BLUE (TOWEL DISPOSABLE) ×4 IMPLANT
TRAY LAPAROSCOPIC (CUSTOM PROCEDURE TRAY) ×4 IMPLANT
TROCAR BLADELESS OPT 5 100 (ENDOMECHANICALS) ×4 IMPLANT
TROCAR XCEL BLUNT TIP 100MML (ENDOMECHANICALS) ×4 IMPLANT

## 2015-10-31 NOTE — Progress Notes (Addendum)
Patient has returned from bilateral inquinal hernia repair to short stay to go home today. He complains of numbness right anterior thigh. He has motor ability and is able to ambulate to bathroom with 2 person assist, walker, and gait belt. Only urinated about 50 ml of urine.  Complains of increased abdominal pain after going to bathroom.  1445   Patient ambulated in hallway with one person assist. Tolerated well. Numbness right anterior thigh is better.

## 2015-10-31 NOTE — Anesthesia Preprocedure Evaluation (Addendum)
Anesthesia Evaluation  Patient identified by MRN, date of birth, ID band Patient awake    Reviewed: Allergy & Precautions, NPO status , Patient's Chart, lab work & pertinent test results  History of Anesthesia Complications (+) PONV and history of anesthetic complications  Airway Mallampati: II  TM Distance: >3 FB Neck ROM: Full    Dental no notable dental hx. (+) Dental Advisory Given, Teeth Intact   Pulmonary neg pulmonary ROS,    Pulmonary exam normal breath sounds clear to auscultation       Cardiovascular Exercise Tolerance: Good hypertension, Pt. on medications (-) angina+ DVT  (-) CAD and (-) Past MI Normal cardiovascular exam Rhythm:Regular Rate:Normal     Neuro/Psych negative neurological ROS  negative psych ROS   GI/Hepatic negative GI ROS, Neg liver ROS,   Endo/Other  negative endocrine ROS  Renal/GU negative Renal ROS  negative genitourinary   Musculoskeletal negative musculoskeletal ROS (+)   Abdominal   Peds negative pediatric ROS (+)  Hematology  (+) Blood dyscrasia (Factor V Lieden ), , On chronic anticoagulation   Anesthesia Other Findings Day of surgery medications reviewed with the patient.  Reproductive/Obstetrics negative OB ROS                            Anesthesia Physical Anesthesia Plan  ASA: II  Anesthesia Plan: General   Post-op Pain Management:    Induction: Intravenous  Airway Management Planned: Oral ETT  Additional Equipment: None  Intra-op Plan:   Post-operative Plan: Extubation in OR  Informed Consent: I have reviewed the patients History and Physical, chart, labs and discussed the procedure including the risks, benefits and alternatives for the proposed anesthesia with the patient or authorized representative who has indicated his/her understanding and acceptance.   Dental advisory given  Plan Discussed with: CRNA  Anesthesia Plan  Comments:         Anesthesia Quick Evaluation

## 2015-10-31 NOTE — Transfer of Care (Signed)
Immediate Anesthesia Transfer of Care Note  Patient: Anthony Mckinney  Procedure(s) Performed: Procedure(s): LAPAROSCOPIC BILATERAL INGUINAL HERNIA , LEFT FEMEROL REPAIR WITH MESH AND PRIMARY UMBILICAL HERNIA REPAIR (N/A) HERNIA REPAIR UMBILICAL ADULT  Patient Location: PACU  Anesthesia Type:General  Level of Consciousness:  sedated, patient cooperative and responds to stimulation  Airway & Oxygen Therapy:Patient Spontanous Breathing and Patient connected to face mask oxgen  Post-op Assessment:  Report given to PACU RN and Post -op Vital signs reviewed and stable  Post vital signs:  Reviewed and stable  Last Vitals:  Filed Vitals:   10/31/15 0505  BP: 134/97  Pulse: 96  Temp: 36.5 C  Resp: 18    Complications: No apparent anesthesia complications

## 2015-10-31 NOTE — Anesthesia Postprocedure Evaluation (Signed)
Anesthesia Post Note  Patient: Engineer, maintenance  Procedure(s) Performed: Procedure(s) (LRB): LAPAROSCOPIC BILATERAL INGUINAL HERNIA , LEFT FEMEROL REPAIR WITH MESH AND PRIMARY UMBILICAL HERNIA REPAIR (N/A) HERNIA REPAIR UMBILICAL ADULT  Patient location during evaluation: PACU Anesthesia Type: General Level of consciousness: awake and alert Pain management: pain level controlled Vital Signs Assessment: post-procedure vital signs reviewed and stable Respiratory status: spontaneous breathing, nonlabored ventilation, respiratory function stable and patient connected to nasal cannula oxygen Cardiovascular status: blood pressure returned to baseline and stable Postop Assessment: no signs of nausea or vomiting Anesthetic complications: no    Last Vitals:  Filed Vitals:   10/31/15 1130 10/31/15 1159  BP: 125/71 134/74  Pulse: 74 72  Temp: 36.3 C 36.7 C  Resp: 11 15    Last Pain:  Filed Vitals:   10/31/15 1204  PainSc: [place] Edward Brelyn Woehl

## 2015-10-31 NOTE — Discharge Instructions (Signed)
RESUME ANTICOAGULATION BLOOD THINNERS ON Friday.  Start Lovenox injections in thighs on Friday AM 02DEC.  Start warfarin AFTER the first Lovenox injection as well.  Take Lovenox (enoxaparin) and warfarin as instructed by your hematologist & PCP.     HERNIA REPAIR: POST OP INSTRUCTIONS  1. DIET: Follow a light bland diet the first 24 hours after arrival home, such as soup, liquids, crackers, etc.  Be sure to include lots of fluids daily.  Avoid fast food or heavy meals as your are more likely to get nauseated.  Eat a low fat the next few days after surgery. 2. Take your usually prescribed home medications unless otherwise directed. 3. PAIN CONTROL: a. Pain is best controlled by a usual combination of three different methods TOGETHER: i. Ice/Heat ii. Over the counter pain medication iii. Prescription pain medication b. Most patients will experience some swelling and bruising around the hernia(s) such as the bellybutton, groins, or old incisions.  Ice packs or heating pads (30-60 minutes up to 6 times a day) will help. Use ice for the first few days to help decrease swelling and bruising, then switch to heat to help relax tight/sore spots and speed recovery.  Some people prefer to use ice alone, heat alone, alternating between ice & heat.  Experiment to what works for you.  Swelling and bruising can take several weeks to resolve.   c. It is helpful to take an over-the-counter pain medication regularly for the first few weeks.  Choose one of the following that works best for you: i. Naproxen (Aleve, etc)  Two 220mg  tabs twice a day ii. Ibuprofen (Advil, etc) Three 200mg  tabs four times a day (every meal & bedtime) iii. Acetaminophen (Tylenol, etc) 325-650mg  four times a day (every meal & bedtime) d. A  prescription for pain medication should be given to you upon discharge.  Take your pain medication as prescribed.  i. If you are having problems/concerns with the prescription medicine (does not  control pain, nausea, vomiting, rash, itching, etc), please call us [phone] to see if we need to switch you to a different pain medicine that will work better for you and/or control your side effect better. ii. If you need a refill on your pain medication, please contact your pharmacy.  They will contact our office to request authorization. Prescriptions will not be filled after 5 pm or on week-ends. 4. Avoid getting constipated.  Between the surgery and the pain medications, it is common to experience some constipation.  Increasing fluid intake and taking a fiber supplement (such as Metamucil, Citrucel, FiberCon, MiraLax, etc) 1-2 times a day regularly will usually help prevent this problem from occurring.  A mild laxative (prune juice, Milk of Magnesia, MiraLax, etc) should be taken according to package directions if there are no bowel movements after 48 hours.   5. Wash / shower every day.  You may shower over the dressings as they are waterproof.   6. Remove your waterproof bandages 5 days after surgery.  You may leave the incision open to air.  You may replace a dressing/Band-Aid to cover the incision for comfort if you wish.  Continue to shower over incision(s) after the dressing is off.    7. ACTIVITIES as tolerated:   a. You may resume regular (light) daily activities beginning the next day--such as daily self-care, walking, climbing stairs--gradually increasing activities as tolerated.  If you can walk 30 minutes without difficulty, it is safe to try more intense activity such as  jogging, treadmill, bicycling, low-impact aerobics, swimming, etc. b. Save the most intensive and strenuous activity for last such as sit-ups, heavy lifting, contact sports, etc  Refrain from any heavy lifting or straining until you are off narcotics for pain control.   c. DO NOT PUSH THROUGH PAIN.  Let pain be your guide: If it hurts to do something, don't do it.  Pain is your body warning you to avoid that  activity for another week until the pain goes down. d. You may drive when you are no longer taking prescription pain medication, you can comfortably wear a seatbelt, and you can safely maneuver your car and apply brakes. e. Dennis Bast may have sexual intercourse when it is comfortable.  8. FOLLOW UP in our office a. Please call [facility] at [phone] to set up an appointment to see your surgeon in the office for a follow-up appointment approximately 2-3 weeks after your surgery. b. Make sure that you call for this appointment the day you arrive home to insure a convenient appointment time. 9.  IF YOU HAVE DISABILITY OR FAMILY LEAVE FORMS, BRING THEM TO THE OFFICE FOR PROCESSING.  DO NOT GIVE THEM TO YOUR DOCTOR.  WHEN TO CALL us [phone]: 1. Poor pain control 2. Reactions / problems with new medications (rash/itching, nausea, etc)  3. Fever over 101.5 F (38.5 C) 4. Inability to urinate 5. Nausea and/or vomiting 6. Worsening swelling or bruising 7. Continued bleeding from incision. 8. Increased pain, redness, or drainage from the incision   The clinic staff is available to answer your questions during regular business hours (8:30am-5pm).  Please dont hesitate to call and ask to speak to one of our nurses for clinical concerns.   If you have a medical emergency, go to the nearest emergency room or call 911.  A surgeon from [facility] is always on call at the hospitals in [facility], [place], [place], [place], [place]  [address] ?  [address] [address], [place], [place]   [address] MAIN: [phone] ? TOLL FREE: [phone] ? FAX: [phone] [email or web address]  GETTING TO GOOD BOWEL HEALTH. Irregular bowel habits such as constipation and diarrhea can lead to many problems over time.  Having one soft bowel movement a day is the most important way to prevent further problems.  The anorectal canal is designed to handle stretching  and feces to safely manage our ability to get rid of solid waste (feces, poop, stool) out of our body.  BUT, hard constipated stools can act like ripping concrete bricks and diarrhea can be a burning fire to this very sensitive area of our body, causing inflamed hemorrhoids, anal fissures, increasing risk is perirectal abscesses, abdominal pain/bloating, an making irritable bowel worse.      The goal: ONE SOFT BOWEL MOVEMENT A DAY!  To have soft, regular bowel movements:   Drink plenty of fluids, consider 4-6 tall glasses of water a day.    Take plenty of fiber.  Fiber is the undigested part of plant food that passes into the colon, acting s natures broom to encourage bowel motility and movement.  Fiber can absorb and hold large amounts of water. This results in a larger, bulkier stool, which is soft and easier to pass. Work gradually over several weeks up to 6 servings a day of fiber (25g a day even more if needed) in the form of: o Vegetables -- Root (potatoes, carrots, turnips), leafy green (lettuce, salad greens,  celery, spinach), or cooked high residue (cabbage, broccoli, etc) o Fruit -- Fresh (unpeeled skin & pulp), Dried (prunes, apricots, cherries, etc ),  or stewed ( applesauce)  o Whole grain breads, pasta, etc (whole wheat)  o Bran cereals   Bulking Agents -- This type of water-retaining fiber generally is easily obtained each day by one of the following:  o Psyllium bran -- The psyllium plant is remarkable because its ground seeds can retain so much water. This product is available as Metamucil, Konsyl, Effersyllium, Per Diem Fiber, or the less expensive generic preparation in drug and health food stores. Although labeled a laxative, it really is not a laxative.  o Methylcellulose -- This is another fiber derived from wood which also retains water. It is available as Citrucel. o Polyethylene Glycol - and artificial fiber commonly called Miralax or Glycolax.  It is helpful for people  with gassy or bloated feelings with regular fiber o Flax Seed - a less gassy fiber than psyllium  No reading or other relaxing activity while on the toilet. If bowel movements take longer than 5 minutes, you are too constipated  AVOID CONSTIPATION.  High fiber and water intake usually takes care of this.  Sometimes a laxative is needed to stimulate more frequent bowel movements, but   Laxatives are not a good long-term solution as it can wear the colon out.  They can help jump-start bowels if constipated, but should be relied on constantly without discussing with your doctor o Osmotics (Milk of Magnesia, Fleets phosphosoda, Magnesium citrate, MiraLax, GoLytely) are safer than  o Stimulants (Senokot, Castor Oil, Dulcolax, Ex Lax)    o Avoid taking laxatives for more than 7 days in a row.   IF SEVERELY CONSTIPATED, try a Bowel Retraining Program: o Do not use laxatives.  o Eat a diet high in roughage, such as bran cereals and leafy vegetables.  o Drink six (6) ounces of prune or apricot juice each morning.  o Eat two (2) large servings of stewed fruit each day.  o Take one (1) heaping tablespoon of a psyllium-based bulking agent twice a day. Use sugar-free sweetener when possible to avoid excessive calories.  o Eat a normal breakfast.  o Set aside 15 minutes after breakfast to sit on the toilet, but do not strain to have a bowel movement.  o If you do not have a bowel movement by the third day, use an enema and repeat the above steps.   Controlling diarrhea o Switch to liquids and simpler foods for a few days to avoid stressing your intestines further. o Avoid dairy products (especially milk & ice cream) for a short time.  The intestines often can lose the ability to digest lactose when stressed. o Avoid foods that cause gassiness or bloating.  Typical foods include beans and other legumes, cabbage, broccoli, and dairy foods.  Every person has some sensitivity to other foods, so listen to our  body and avoid those foods that trigger problems for you. o Adding fiber (Citrucel, Metamucil, psyllium, Miralax) gradually can help thicken stools by absorbing excess fluid and retrain the intestines to act more normally.  Slowly increase the dose over a few weeks.  Too much fiber too soon can backfire and cause cramping & bloating. o Probiotics (such as active yogurt, Align, etc) may help repopulate the intestines and colon with normal bacteria and calm down a sensitive digestive tract.  Most studies show it to be of mild help, though, and such products  can be costly. o Medicines: - Bismuth subsalicylate (ex. Kayopectate, Pepto Bismol) every 30 minutes for up to 6 doses can help control diarrhea.  Avoid if pregnant. - Loperamide (Immodium) can slow down diarrhea.  Start with two tablets (4mg  total) first and then try one tablet every 6 hours.  Avoid if you are having fevers or severe pain.  If you are not better or start feeling worse, stop all medicines and call your doctor for advice o Call your doctor if you are getting worse or not better.  Sometimes further testing (cultures, endoscopy, X-ray studies, bloodwork, etc) may be needed to help diagnose and treat the cause of the diarrhea.  TROUBLESHOOTING IRREGULAR BOWELS 1) Avoid extremes of bowel movements (no bad constipation/diarrhea) 2) Miralax 17gm mixed in 8oz. water or juice-daily. May use BID as needed.  3) Gas-x,Phazyme, etc. as needed for gas & bloating.  4) Soft,bland diet. No spicy,greasy,fried foods.  5) Prilosec over-the-counter as needed  6) May hold gluten/wheat products from diet to see if symptoms improve.  7)  May try probiotics (Align, Activa, etc) to help calm the bowels down 7) If symptoms become worse call back immediately.  Managing Pain  Pain after surgery or related to activity is often due to strain/injury to muscle, tendon, nerves and/or incisions.  This pain is usually short-term and will improve in a few months.     Many people find it helpful to do the following things TOGETHER to help speed the process of healing and to get back to regular activity more quickly:  1. Avoid heavy physical activity at first a. No lifting greater than 20 pounds at first, then increase to lifting as tolerated over the next few weeks b. Do not push through the pain.  Listen to your body and avoid positions and maneuvers than reproduce the pain.  Wait a few days before trying something more intense c. Walking is okay as tolerated, but go slowly and stop when getting sore.  If you can walk 30 minutes without stopping or pain, you can try more intense activity (running, jogging, aerobics, cycling, swimming, treadmill, sex, sports, weightlifting, etc ) d. Remember: If it hurts to do it, then dont do it!  2. Take Anti-inflammatory medication a. Choose ONE of the following over-the-counter medications: i.            Acetaminophen 500mg  tabs (Tylenol) 1-2 pills with every meal and just before bedtime (avoid if you have liver problems) ii.            Naproxen 220mg  tabs (ex. Aleve) 1-2 pills twice a day (avoid if you have kidney, stomach, IBD, or bleeding problems) iii. Ibuprofen 200mg  tabs (ex. Advil, Motrin) 3-4 pills with every meal and just before bedtime (avoid if you have kidney, stomach, IBD, or bleeding problems) b. Take with food/snack around the clock for 1-2 weeks i. This helps the muscle and nerve tissues become less irritable and calm down faster  3. Use a Heating pad or Ice/Cold Pack a. 4-6 times a day b. May use warm bath/hottub  or showers  4. Try Gentle Massage and/or Stretching  a. at the area of pain many times a day b. stop if you feel pain - do not overdo it  Try these steps together to help you body heal faster and avoid making things get worse.  Doing just one of these things may not be enough.    If you are not getting better after two weeks or are  noticing you are getting worse, contact our office  for further advice; we may need to re-evaluate you & see what other things we can do to help.  Managing Pain  Pain after surgery or related to activity is often due to strain/injury to muscle, tendon, nerves and/or incisions.  This pain is usually short-term and will improve in a few months.   Many people find it helpful to do the following things TOGETHER to help speed the process of healing and to get back to regular activity more quickly:  5. Avoid heavy physical activity at first a. No lifting greater than 20 pounds at first, then increase to lifting as tolerated over the next few weeks b. Do not push through the pain.  Listen to your body and avoid positions and maneuvers than reproduce the pain.  Wait a few days before trying something more intense c. Walking is okay as tolerated, but go slowly and stop when getting sore.  If you can walk 30 minutes without stopping or pain, you can try more intense activity (running, jogging, aerobics, cycling, swimming, treadmill, sex, sports, weightlifting, etc ) d. Remember: If it hurts to do it, then dont do it!  6. Take Anti-inflammatory medication a. Choose ONE of the following over-the-counter medications: i.            Acetaminophen 500mg  tabs (Tylenol) 1-2 pills with every meal and just before bedtime (avoid if you have liver problems) ii.            Naproxen 220mg  tabs (ex. Aleve) 1-2 pills twice a day (avoid if you have kidney, stomach, IBD, or bleeding problems) iii. Ibuprofen 200mg  tabs (ex. Advil, Motrin) 3-4 pills with every meal and just before bedtime (avoid if you have kidney, stomach, IBD, or bleeding problems) b. Take with food/snack around the clock for 1-2 weeks i. This helps the muscle and nerve tissues become less irritable and calm down faster  7. Use a Heating pad or Ice/Cold Pack a. 4-6 times a day b. May use warm bath/hottub  or showers  8. Try Gentle Massage and/or Stretching  a. at the area of pain many times a  day b. stop if you feel pain - do not overdo it  Try these steps together to help you body heal faster and avoid making things get worse.  Doing just one of these things may not be enough.    If you are not getting better after two weeks or are noticing you are getting worse, contact our office for further advice; we may need to re-evaluate you & see what other things we can do to help.    General Anesthesia, Adult, Care After Refer to this sheet in the next few weeks. These instructions provide you with information on caring for yourself after your procedure. Your health care provider may also give you more specific instructions. Your treatment has been planned according to current medical practices, but problems sometimes occur. Call your health care provider if you have any problems or questions after your procedure. WHAT TO EXPECT AFTER THE PROCEDURE After the procedure, it is typical to experience:  Sleepiness.  Nausea and vomiting. HOME CARE INSTRUCTIONS  For the first 24 hours after general anesthesia:  Have a responsible person with you.  Do not drive a car. If you are alone, do not take public transportation.  Do not drink alcohol.  Do not take medicine that has not been prescribed by your health care provider.  Do not  sign important papers or make important decisions.  You may resume a normal diet and activities as directed by your health care provider.  Change bandages (dressings) as directed.  If you have questions or problems that seem related to general anesthesia, call the hospital and ask for the anesthetist or anesthesiologist on call. SEEK MEDICAL CARE IF:  You have nausea and vomiting that continue the day after anesthesia.  You develop a rash. SEEK IMMEDIATE MEDICAL CARE IF:   You have difficulty breathing.  You have chest pain.  You have any allergic problems.   This information is not intended to replace advice given to you by your health care  provider. Make sure you discuss any questions you have with your health care provider.   Document Released: 02/23/2001 Document Revised: 12/08/2014 Document Reviewed: 03/17/2012 Elsevier Interactive Patient Education [employer].

## 2015-10-31 NOTE — H&P (View-Only) (Signed)
Anthony Mckinney  Location: [facility] Patient #: [id number] DOB: 12/19/1955 Married / Language: English / Race: White Male   History of Present Illness Anthony Hector MD; 04/23/2015 3:36 PM) Patient words: [facility].  The patient is a 59 year old male who presents with an inguinal hernia. Patient comes in today for concern of RIGHT greater than LEFT inguinal hernias. He is noted some swelling in his groin suspicion for hernia for many years. They've mildly gotten larger. He's had some episodes were to become sensitive or painful especially with exercise. He walks several miles without difficulty. Normally has a bowel movement every day or so. Diagnosed with prostate cancer. Underwent Robotic prostatectomy earlier this year. Resection of bladder diverticulum. Recovered from that. He has a factor V Leiden deficiency = on chronic warfarin anticoagulation. He tolerated the Lovenox bridge perioperatively. He wishes to be aggressive and have the hernias addressed.     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 Losartan Potassium-HCTZ (100-25MG  Tablet, Oral) Active. Phentermine HCl (37.5MG  Tablet, Oral) Active. Hydrocodone-Acetaminophen (5-325MG  Tablet, Oral) Active. Ciprofloxacin HCl (500MG  Tablet, Oral) Active. Enoxaparin Sodium (100MG /ML Solution,  Subcutaneous) Active. Tamsulosin HCl (0.4MG  Capsule, Oral) Active. Medications Reconciled  Social History Anthony Mckinney, [place]; 04/23/2015 8:49 AM) Caffeine use Carbonated beverages. No alcohol use No drug use Tobacco use Never smoker.  Family History Anthony Mckinney, [place]; 04/23/2015 8:49 AM) Hypertension Father. Respiratory Condition Father. Thyroid problems Mother.    Review of Systems Anthony Mckinney CMA; 04/23/2015 8:49 AM) General Not Present- Appetite Loss, Chills, Fatigue, Fever, Night Sweats, Weight Gain and Weight Loss. Skin Not Present- Change in Wart/Mole, Dryness, Hives, Jaundice, New Lesions, Non-Healing Wounds, Rash and Ulcer. HEENT Present- Hearing Loss, Ringing in the Ears and Wears glasses/contact lenses. Not Present- Earache, Hoarseness, Nose Bleed, Oral Ulcers, Seasonal Allergies, Sinus Pain, Sore Throat, Visual Disturbances and Yellow Eyes. Respiratory Not Present- Bloody sputum, Chronic Cough, Difficulty Breathing, Snoring and Wheezing. Breast Not Present- Breast Mass, Breast Pain, Nipple Discharge and Skin Changes. Cardiovascular Present- Swelling of Extremities. Not Present- Chest Pain, Difficulty Breathing Lying Down, Leg Cramps, Palpitations, Rapid Heart Rate and Shortness of Breath. Gastrointestinal Present- Constipation. Not Present- Abdominal Pain, Bloating, Bloody Stool, Change in Bowel Habits, Chronic diarrhea, Difficulty Swallowing, Excessive gas, Gets full quickly at meals, Hemorrhoids, Indigestion, Nausea, Rectal Pain and Vomiting. Male Genitourinary Present- Impotence. Not Present- Blood in Urine, Change in Urinary Stream, Frequency, Nocturia, Painful Urination, Urgency and Urine Leakage. Musculoskeletal Present- Back Pain and Joint Pain. Not Present- Joint Stiffness, Muscle Pain, Muscle Weakness and Swelling of Extremities. Neurological Not Present- Decreased Memory, Fainting, Headaches, Numbness, Seizures, Tingling, Tremor, Trouble walking and  Weakness. Psychiatric Not Present- Anxiety, Bipolar, Change in Sleep Pattern, Depression, Fearful and Frequent crying. Endocrine Not Present- Cold Intolerance, Excessive Hunger, Hair Changes, Heat Intolerance, Hot flashes and New Diabetes. Hematology Not Present- Easy Bruising, Excessive bleeding, Gland problems, HIV and Persistent Infections.  Vitals Anthony Mckinney CMA; 04/23/2015 8:50 AM) 04/23/2015 8:50 AM Weight: 274.6 lb Height: 73in  Body Surface Area: 2.53 m Body Mass Index: 36.23 kg/m  Temp.: 97.41F(Oral)  Pulse: 90 (Regular)  Resp.: 19 (Unlabored)  BP: 130/70 (Sitting, Left Arm, Standard)     Physical Exam Anthony Hector MD; 04/23/2015 3:36 PM) General Mental Status-Alert. General Appearance-Not in acute distress, Not Sickly. Orientation-Oriented X3. Hydration-Well hydrated. Voice-Normal.  Integumentary Global Assessment Upon inspection and palpation of skin surfaces of the - Axillae: non-tender, no inflammation or ulceration, no drainage. and Distribution of scalp and body hair is normal. General Characteristics Temperature - normal warmth is noted.  Head and Neck Head-normocephalic, atraumatic with no lesions or palpable masses. Face Global Assessment - atraumatic, no absence of expression. Neck Global Assessment - no abnormal movements, no bruit auscultated on the right, no bruit auscultated on the left, no decreased range of motion, non-tender. Trachea-midline. Thyroid Gland Characteristics - non-tender.  Eye Eyeball - Left-Extraocular movements intact, No Nystagmus. Eyeball - Right-Extraocular movements intact, No Nystagmus. Cornea - Left-No Hazy. Cornea - Right-No Hazy. Sclera/Conjunctiva - Left-No scleral icterus, No Discharge. Sclera/Conjunctiva - Right-No scleral icterus, No Discharge. Pupil - Left-Direct reaction to light normal. Pupil - Right-Direct reaction to light normal.  ENMT Ears Pinna - Left - no  drainage observed, no generalized tenderness observed. Right - no drainage observed, no generalized tenderness observed. Nose and Sinuses External Inspection of the Nose - no destructive lesion observed. Inspection of the nares - Left - quiet respiration. Right - quiet respiration. Mouth and Throat Lips - Upper Lip - no fissures observed, no pallor noted. Lower Lip - no fissures observed, no pallor noted. Nasopharynx - no discharge present. Oral Cavity/Oropharynx - Tongue - no dryness observed. Oral Mucosa - no cyanosis observed. Hypopharynx - no evidence of airway distress observed.  Chest and Lung Exam Inspection Movements - Normal and Symmetrical. Accessory muscles - No use of accessory muscles in breathing. Palpation Palpation of the chest reveals - Non-tender. Auscultation Breath sounds - Normal and Clear.  Cardiovascular Auscultation Rhythm - Regular. Murmurs & Other Heart Sounds - Auscultation of the heart reveals - No Murmurs and No Systolic Clicks.  Abdomen Inspection Inspection of the abdomen reveals - No Visible peristalsis and No Abnormal pulsations. Umbilicus - No Bleeding, No Urine drainage. Palpation/Percussion Palpation and Percussion of the abdomen reveal - Soft, Non Tender, No Rebound tenderness, No Rigidity (guarding) and No Cutaneous hyperesthesia. Note: robotic port site healed   Male Genitourinary Sexual Maturity Tanner 5 - Adult hair pattern and Adult penile size and shape. Note: Normal external male genitalia circumcised testes bilaterally. Mildly atrophic. Obvious RIGHT groin inguinal hernia partially reducible. Obvious bulge in LEFT groin suspicious for direct inguinal hernia.   Peripheral Vascular Upper Extremity Inspection - Left - No Cyanotic nailbeds, Not Ischemic. Right - No Cyanotic nailbeds, Not Ischemic.  Neurologic Neurologic evaluation reveals -normal attention span and ability to concentrate, able to name objects and repeat phrases.  Appropriate fund of knowledge , normal sensation and normal coordination. Mental Status Affect - not angry, not paranoid. Cranial Nerves-Normal Bilaterally. Gait-Normal.  Neuropsychiatric Mental status exam performed with findings of-able to articulate well with normal speech/language, rate, volume and coherence, thought content normal with ability to perform basic computations and apply abstract reasoning and no evidence of hallucinations, delusions, obsessions or homicidal/suicidal ideation.  Musculoskeletal Global Assessment Spine, Ribs and Pelvis - no instability, subluxation or laxity. Right Upper Extremity - no instability, subluxation or laxity.  Lymphatic Head & Neck  General Head & Neck Lymphatics: Bilateral - Description - No Localized  lymphadenopathy. Axillary  General Axillary Region: Bilateral - Description - No Localized lymphadenopathy. Femoral & Inguinal  Generalized Femoral & Inguinal Lymphatics: Left - Description - No Localized lymphadenopathy. Right - Description - No Localized lymphadenopathy.    Assessment & Plan Anthony Hector MD; 04/23/2015 9:24 AM) BILATERAL INGUINAL HERNIA WITHOUT OBSTRUCTION OR GANGRENE, RECURRENCE NOT SPECIFIED (550.92  K40.20) Current Plans Schedule for Surgery  Written instructions provided  Discussed regular exercise with patient.  The anatomy & physiology of the abdominal wall and pelvic floor was discussed.  The pathophysiology of hernias in the inguinal and pelvic region was discussed.  Natural history risks such as progressive enlargement, pain, incarceration, and strangulation was discussed.   Contributors to complications such as smoking, obesity, diabetes, prior surgery, etc were discussed.    I feel the risks of no intervention will lead to serious problems that outweigh the operative risks; therefore, I recommended surgery to reduce and repair the hernia.  I explained laparoscopic techniques with possible need for  an open approach.  I noted usual use of mesh to patch and/or buttress hernia repair  Risks such as bleeding, infection, abscess, need for further treatment, injury to other organs, need for repair of tissues / organs, stroke, heart attack, death, and other risks were discussed.  I noted a good likelihood this will help address the problem.   Goals of post-operative recovery were discussed as well.  Possibility that this will not correct all symptoms was explained.  I stressed the importance of low-impact activity, aggressive pain control, avoiding constipation, & not pushing through pain to minimize risk of post-operative chronic pain or injury. Possibility of reherniation was discussed.  We will work to minimize complications.     An educational handout further explaining the pathology & treatment options was given as well.  Questions were answered.  The patient expresses understanding & wishes to proceed with surgery.  Pt ready for surgery now - held off.  Cleared by cardiology w Lovenox bridge considered.  Completed surgical orders turned into scheduling tray. Received clearance by DR Harrington Challenger with Lovenox bridging instructions. April with Dr Harrington Challenger will go over the Lovenox bridging instructions with the pt for sx in November 2016.  Pt Education - [facility] Hernia Post-Op HCI (Angelys Yetman): discussed with patient and provided information. Pt Education - [facility] Pain Control (Chery Giusto) Pt Education - [facility] Pelvic Floor Exercises (Kegels) and Dysfunction HCI (Kash Mothershead)  Anthony Mckinney, M.D., F.A.C.S. Gastrointestinal and Minimally Invasive Surgery [facility] [address] [place], [place] [address] [phone] Main / Paging

## 2015-10-31 NOTE — Anesthesia Procedure Notes (Signed)
Procedure Name: Intubation Date/Time: 10/31/2015 8:26 AM Performed by: Dimas Millin, Kaylina Cahue F Pre-anesthesia Checklist: Patient identified, Emergency Drugs available, Suction available, Patient being monitored and Timeout performed Patient Re-evaluated:Patient Re-evaluated prior to inductionOxygen Delivery Method: Circle system utilized Preoxygenation: Pre-oxygenation with 100% oxygen Intubation Type: IV induction Laryngoscope Size: Miller and 2 Grade View: Grade I Tube type: Oral Tube size: 7.5 mm Number of attempts: 1 Airway Equipment and Method: Stylet Placement Confirmation: ETT inserted through vocal cords under direct vision,  positive ETCO2 and breath sounds checked- equal and bilateral Secured at: 22 cm Tube secured with: Tape Dental Injury: Teeth and Oropharynx as per pre-operative assessment

## 2015-10-31 NOTE — Interval H&P Note (Signed)
History and Physical Interval Note:  10/31/2015 7:33 AM  Anthony Mckinney  has presented today for surgery, with the diagnosis of Bilateral Inguinal Hernias  The various methods of treatment have been discussed with the patient and family. After consideration of risks, benefits and other options for treatment, the patient has consented to  Procedure(s): [place] (N/A) as a surgical intervention .  The patient's history has been reviewed, patient examined, no change in status, stable for surgery.  I have reviewed the patient's chart and labs.  Questions were answered to the patient's satisfaction.     Elgie Maziarz C.

## 2015-10-31 NOTE — Op Note (Addendum)
10/31/2015  10:45 AM  PATIENT:  Anthony Mckinney  59 y.o. male  Patient Care Team: Lona Kettle, MD as PCP - General (Family Medicine) Raynelle Bring, MD as Consulting Physician (Urology) Michael Boston, MD as Consulting Physician (General Surgery) Annia Belt, MD as Consulting Physician (Oncology) Fay Records, MD as Consulting Physician (Cardiology)  PRE-OPERATIVE DIAGNOSIS:  Bilateral Inguinal Hernias  POST-OPERATIVE DIAGNOSIS:   Bilateral Inguinal Hernias  Left femoral hernia Umbilical hernia  PROCEDURE:   LAPAROSCOPIC BILATERAL INGUINAL HERNIA WITH MESH LEFT FEMORAL HERNIA REPAIR WITH MESH PRIMARY UMBILICAL HERNIA REPAIR   SURGEON:  Surgeon(s): Michael Boston, MD  ASSISTANT: RN   ANESTHESIA:   Regional ilioinguinal and genitofemoral and spermatic cord nerve blocks with GETA  EBL:  Total I/O In: 1000 [I.V.:1000] Out: -   Delay start of Pharmacological VTE agent (>24hrs) due to surgical blood loss or risk of bleeding:  no  DRAINS: NONE  SPECIMEN:  NONE  DISPOSITION OF SPECIMEN:  N/A  COUNTS:  YES  PLAN OF CARE: Discharge to home after PACU  PATIENT DISPOSITION:  PACU - hemodynamically stable.  INDICATION: Pleasant male with history of prostate cancer status post robotic prostatectomy in January.  Also on chronic anticoagulation for factor V Leiden deficiency.  Found to have symptomatic inguinal hernias.  I recommended left Possible open exploration and repair  The anatomy & physiology of the abdominal wall and pelvic floor was discussed.  The pathophysiology of hernias in the inguinal and pelvic region was discussed.  Natural history risks such as progressive enlargement, pain, incarceration & strangulation was discussed.   Contributors to complications such as smoking, obesity, diabetes, prior surgery, etc were discussed.    I feel the risks of no intervention will lead to serious problems that outweigh the operative risks; therefore, I recommended surgery  to reduce and repair the hernia.  I explained laparoscopic techniques with possible need for an open approach.  I noted usual use of mesh to patch and/or buttress hernia repair  Risks such as bleeding, infection, abscess, need for further treatment, heart attack, death, and other risks were discussed.  I noted a good likelihood this will help address the problem.   Goals of post-operative recovery were discussed as well.  Possibility that this will not correct all symptoms was explained.  I stressed the importance of low-impact activity, aggressive pain control, avoiding constipation, & not pushing through pain to minimize risk of post-operative chronic pain or injury. Possibility of reherniation was discussed.  We will work to minimize complications.     An educational handout further explaining the pathology & treatment options was given as well.  Questions were answered.  The patient expresses understanding & wishes to proceed with surgery.  OR FINDINGS: Left pantaloon type indirect/direct inguinal hernia.  Small femoral hernias well.  No obturator hernia.  On the right side larger indirect then direct pantaloon type inguinal hernia.  No definite femoral or obturator hernia.  DESCRIPTION:   The patient was identified & brought into the operating room. The patient was positioned supine with arms tucked. SCDs were active during the entire case. The patient underwent general anesthesia without any difficulty.  The abdomen was prepped and draped in a sterile fashion. The patient's bladder was emptied.  A Surgical Timeout confirmed our plan.  I made a transverse incision through the inferior umbilical fold.  I made a small transverse nick through the anterior rectus fascia contralateral to the inguinal hernia side and placed a 0-vicryl stitch through  the fascia.  I placed a Hasson trocar into the preperitoneal plane.  Entry was clean.  We induced carbon dioxide insufflation. Camera inspection revealed no  injury.  I used a 43mm angled scope to bluntly free the peritoneum off the infraumbilical anterior abdominal wall.  I created enough of a preperitoneal pocket to place 33mm ports into the right & left mid-abdomen into this preperitoneal cavity.  I focused attention on the right side since that was the dominant hernia side.   I used blunt & focused sharp dissection to free the peritoneum off the flank and down to the pubic rim.  I freed the anteriolateral bladder wall off the anteriolateral pelvic wall, sparing midline attachments.   I located a swath of peritoneum going into a hernia fascial defect at the direct space consistent with a direct inguinal hernia.  I gradually freed the peritoneal hernia sac off safely and reduced it into the preperitoneal space.  Patient had large spermatic cord lipomas that were skeletonized transected and removed.  Freed moderate spermatic cord lipomas out of the inguinal canal and reduced a moderate indirect hernia as well, pantaloon type.  It took some time to gradually freed off adhesions to the pubis and down the pelvis to help expose and mobilized tissue away from the obturator and femoral foramina.  I freed the peritoneum off the spermatic vessels & vas deferens  did transect the peritoneum off the stump of the vas deferens consistent with his prior prostate resection.  I did a high ligation of sac and close peritoneal defect using laparoscopic intracorporeal suturing with 3-0 Vicryl suture.  I freed peritoneum off the retroperitoneum along the psoas muscle.    I checked & assured hemostasis.    I turned attention on the opposite side.  I did dissection in a similar, mirror-image fashion. The patient had a larger direct/indirect pantaloon type hernia.  Again moderate spore size spermatic cord lipomas were isolated skeletonized transected and removed again I did a high ligation of the hernia sac using 3-0 Vicryl intracorporeal suturing.    I chose 15x15 cm sheets of  ultra-lightweight polypropylene mesh (Ultrapro), one for each side.  I cut a single sigmoid-shaped slit ~6cm from a corner of each mesh.  I placed the meshes into the preperitoneal space & laid them as overlapping diamonds such that at the inferior points, a 6x6 cm corner flap rested in the true anterolateral pelvis, covering the obturator & femoral foramina.   I allowed the bladder to fall back and help tuck the corners of the mesh in.  The medial corners overlapped each other across midline cephalad to the pubic rim.   This provided >2 inch coverage around the hernia.  Because he had a bulky pantaloon type hernia as I laid a third mesh centrally as a diamond with good overlap between the right and left mesh.  Because of that good overlap I decided to not place tacks to hold the mesh in place  I held the hernia sacs cephalad & evacuated carbon dioxide.  I transected the umbilical stalk off the fascia to expose a 1 cm umbilical hernia with some preperitoneal fat incarcerated within it.  I transected that off.  I primarily closed the umbilical hernia using #1 PDS interrupted suture.  Closed the left portside fascial defect with Vicryl and PDS suture.  Re-tacked the umbilical stalk down.  I closed the fascia with absorbable suture.  I closed the skin using 4-0 monocryl stitch.  Sterile dressings were applied.  The patient was extubated & arrived in the PACU in stable condition..  I had discussed postoperative care with the patient in the holding area.   I did discuss operative findings and postoperative goals / instructions to family as well.  Instructions are written in the chart.  [name], M.D., F.A.C.S. Gastrointestinal and Minimally Invasive Surgery [facility] [address] [place], [place] [address] [phone] Main / Paging

## 2016-04-15 LAB — PROTIME-INR: INR: 1.6 — AB (ref 0.9–1.1)

## 2016-04-16 ENCOUNTER — Encounter: Payer: Self-pay | Admitting: Interventional Cardiology

## 2016-04-16 ENCOUNTER — Ambulatory Visit (INDEPENDENT_AMBULATORY_CARE_PROVIDER_SITE_OTHER): Payer: BLUE CROSS/BLUE SHIELD | Admitting: Interventional Cardiology

## 2016-04-16 VITALS — BP 130/82 | HR 74 | Ht 73.0 in | Wt 270.0 lb

## 2016-04-16 DIAGNOSIS — E669 Obesity, unspecified: Secondary | ICD-10-CM

## 2016-04-16 DIAGNOSIS — D6851 Activated protein C resistance: Secondary | ICD-10-CM | POA: Diagnosis not present

## 2016-04-16 DIAGNOSIS — I10 Essential (primary) hypertension: Secondary | ICD-10-CM

## 2016-04-16 NOTE — Patient Instructions (Addendum)
Medication Instructions:  Your physician recommends that you continue on your current medications as directed. Please refer to the Current Medication list given to you today.   Labwork: None ordered  Testing/Procedures: Your physician has requested that you have an exercise tolerance test. For further information please visit [email or web address]. Please also follow instruction sheet, as given.   Follow-Up: Your physician wants you to follow-up in: AS NEEDED  .   Any Other Special Instructions Will Be Listed Below (If Applicable).   If you need a refill on your cardiac medications before your next appointment, please call your pharmacy.

## 2016-04-16 NOTE — Progress Notes (Signed)
Patient ID: Anthony Mckinney, male   DOB: [birth year], 60 y.o.   MRN: [id number]     Cardiology Office Note   Date:  04/16/2016   ID:  Jeannette How Scroggins, DOB 10/20/1956, MRN [id number]  PCP:   Melinda Crutch, MD    Chief Complaint  Patient presents with  . New Evaluation    saw Dr. Irish Lack previously     Wt Readings from Last 3 Encounters:  04/16/16 270 lb (122.471 kg)  10/31/15 265 lb (120.203 kg)  10/23/15 265 lb (120.203 kg)       History of Present Illness: Anthony Mckinney is a 60 y.o. male  Who has had Factor V Leiden mutation and takes Coumadin for this. He is planning a trip abroad and would like to be checked out before this.    He has lost 100 lbs in the last 3 years.  He has decreased som eBP meds.  He plays golf.  He notes that if he walks a lot, he gets dizzy.  He denies [facility] or chest discomfort.    Father died from a PE at age 5.    THe patient had prostate surgery a year ago.    He has some chronic left hip pain.       Past Medical History  Diagnosis Date  . Hypertension   . Pilonidal cyst   . Factor V Leiden ([place])     on chronic treatment  . DVT (deep venous thrombosis) ([place]) 10/2006  . Cancer [facility])     prostate cancer  . PONV (postoperative nausea and vomiting)     n/v years ago with ether  . Pneumonia     age 72/viral  . Tinnitus     more per right ear  . High frequency hearing loss     more per right ear / wears hearing aides  . Urinary tract infection     3 to 4 years ago   . Nocturia     Past Surgical History  Procedure Laterality Date  . Pilonidal cyst removal  1970's  . Colonscopy      x 3  . Wisdom ttooth extraction  1978  . Tonsillectomy  as child  . Robot assisted laparoscopic radical prostatectomy N/A 12/25/2014    Procedure: ROBOTIC ASSISTED LAPAROSCOPIC RADICAL PROSTATECTOMY LEVEL 2;  Surgeon: Raynelle Bring, MD;  Location: [facility];  Service: Urology;  Laterality: N/A;  . Lymphadenectomy Bilateral 12/25/2014    Procedure:  LYMPHADENECTOMY;  Surgeon: Raynelle Bring, MD;  [facility];  Service: Urology;  Laterality: Bilateral;  . Robotic assisted laparoscopic bladder diverticulectomy N/A 12/25/2014    Procedure: ROBOTIC ASSISTED LAPAROSCOPIC BLADDER DIVERTICULECTOMY;  Surgeon: Raynelle Bring, MD;  [facility];  Service: Urology;  Laterality: N/A;  . Cystoscopy with stent placement Left 12/25/2014    Procedure: CYSTOSCOPY WITH STENT PLACEMENT;  Surgeon: Raynelle Bring, MD;  [facility];  Service: Urology;  Laterality: Left;  Marland Kitchen Eye surgery      lasik bilat   . Inguinal hernia repair N/A 10/31/2015    Procedure: LAPAROSCOPIC BILATERAL INGUINAL HERNIA , LEFT FEMEROL REPAIR WITH MESH AND PRIMARY UMBILICAL HERNIA REPAIR;  Surgeon: Michael Boston, MD;  [facility];  Service: General;  Laterality: N/A;  . Umbilical hernia repair  10/31/2015    Procedure: HERNIA REPAIR UMBILICAL ADULT;  Surgeon: Michael Boston, MD;  [facility];  Service: General;;     Current Outpatient Prescriptions  Medication Sig Dispense Refill  . acetaminophen (  TYLENOL) 325 MG tablet Take 650 mg by mouth every 6 (six) hours as needed.    . celecoxib (CELEBREX) 200 MG capsule Take 100 mg by mouth daily.    Marland Kitchen enoxaparin (LOVENOX) 100 MG/ML injection Inject 100 mg into the skin every 12 (twelve) hours.    Marland Kitchen losartan-hydrochlorothiazide (HYZAAR) 100-25 MG per tablet Take 1 tablet by mouth daily.    Marland Kitchen losartan-hydrochlorothiazide (HYZAAR) 50-12.5 MG tablet Take 1 tablet by mouth daily.    . Multiple Vitamins-Minerals (MULTIVITAMIN WITH MINERALS) tablet Take 1 tablet by mouth daily.    Marland Kitchen oxyCODONE (OXY IR/ROXICODONE) 5 MG immediate release tablet Take 1-2 tablets (5-10 mg total) by mouth every 4 (four) hours as needed for moderate pain, severe pain or breakthrough pain. 40 tablet 0  . phentermine 37.5 MG capsule Take 18.5 mg by mouth every morning.    . warfarin (COUMADIN) 10 MG tablet Take 1 tablet (10 mg total) by mouth every Tuesday,  Thursday, Saturday, and Sunday at 6 PM. 30 tablet 2  . warfarin (COUMADIN) 6 MG tablet Take 1.5 tablets (9 mg total) by mouth every Monday, Wednesday, and Friday at 6 PM. 30 tablet 2   No current facility-administered medications for this visit.    Allergies:   Review of patient's allergies indicates no known allergies.    Social History:  The patient  reports that he has never smoked. He has never used smokeless tobacco. He reports that he does not drink alcohol or use illicit drugs.   Family History:  The patient's family history includes Pulmonary embolism in his father.    ROS:  Please see the history of present illness.   Otherwise, review of systems are positive for hip pain.   All other systems are reviewed and negative.    PHYSICAL EXAM: VS:  BP 130/82 mmHg  Pulse 74  Ht 6\' 1"  (1.854 m)  Wt 270 lb (122.471 kg)  BMI 35.63 kg/m2 , BMI Body mass index is 35.63 kg/(m^2). GEN: Well nourished, well developed, in no acute distress HEENT: normal Neck: no JVD, carotid bruits, or masses Cardiac: RRR; no murmurs, rubs, or gallops,no edema  Respiratory:  clear to auscultation bilaterally, normal work of breathing GI: soft, nontender, nondistended, + BS, obese MS: no deformity or atrophy Skin: warm and dry, no rash Neuro:  Strength and sensation are intact Psych: euthymic mood, full affect   EKG:   The ekg ordered today demonstrates NSR, NSST   Recent Labs: 10/23/2015: BUN 18; Creatinine, Ser 1.13; Hemoglobin 14.7; Platelets 258; Potassium 4.3; Sodium 132*   Lipid Panel No results found for: CHOL, TRIG, HDL, CHOLHDL, VLDL, LDLCALC, LDLDIRECT   Other studies Reviewed: Additional studies/ records that were reviewed today with results demonstrating: Negative stress test in 6/13: 8:00 on the treadmill.   ASSESSMENT AND PLAN:  1. Cardiac risk factors: He has had high blood pressure and obesity. He will be going on a trip next year. He will likely need a stress test to clear  him for this mission trip. Would plan to start with an exercise treadmill test. He will let us know, likely in the fall, when exactly he wants to do the stress test. He will allow [place] time if further workup is needed. 2. Obesity: He is lost 100 pounds. I congratulated him on this. Encouraged healthy lifestyle habits. 3. Factor V Leiden: Continue Coumadin for now.   Current medicines are reviewed at length with the patient today.  The patient concerns regarding his medicines  were addressed.  The following changes have been made:  No change  Labs/ tests ordered today include: ETT around September No orders of the defined types were placed in this encounter.    Recommend 150 minutes/week of aerobic exercise Low fat, low carb, high fiber diet recommended  Disposition:   FU in for stress test later in 2017   SignedLarae Grooms, MD  04/16/2016 9:59 AM    [facility] [address], [place], [place]  [address] Phone: [phone]; Fax: [phone]

## 2016-07-09 ENCOUNTER — Ambulatory Visit (INDEPENDENT_AMBULATORY_CARE_PROVIDER_SITE_OTHER): Payer: BLUE CROSS/BLUE SHIELD

## 2016-07-09 DIAGNOSIS — I10 Essential (primary) hypertension: Secondary | ICD-10-CM

## 2016-07-09 LAB — EXERCISE TOLERANCE TEST
CHL CUP MPHR: 159 {beats}/min
CHL CUP RESTING HR STRESS: 88 {beats}/min
CHL RATE OF PERCEIVED EXERTION: 16
Estimated workload: 7 METS
Exercise duration (min): 6 min
Exercise duration (sec): 0 s
Peak HR: 162 {beats}/min
Percent HR: 101 %

## 2016-07-28 ENCOUNTER — Telehealth: Payer: Self-pay

## 2016-07-28 NOTE — Telephone Encounter (Signed)
Patient is on Coumadin for a non- cardiac indication (Factor V mutation).  He is managed by his PCP.  Will fax back to [facility] so they can contact pt's PCP (Dr. Lona Kettle).

## 2016-07-28 NOTE — Telephone Encounter (Signed)
The pt needs to have Left hip: THA-DA pending receipt of this clearance. They are requesting instructions re: Coumadin and facilitate Lovenox bridging once procedure is scheduled.  Please advise.

## 2016-07-31 ENCOUNTER — Ambulatory Visit: Payer: Self-pay | Admitting: Orthopedic Surgery

## 2016-08-19 ENCOUNTER — Ambulatory Visit: Payer: Self-pay | Admitting: Orthopedic Surgery

## 2016-08-19 NOTE — H&P (Signed)
TOTAL HIP ADMISSION H&P  Patient is admitted for left total hip arthroplasty.  Subjective:  Chief Complaint: left hip pain  HPI: Anthony Mckinney, 60 y.o. male, has a history of pain and functional disability in the left hip(s) due to arthritis and patient has failed non-surgical conservative treatments for greater than 12 weeks to include NSAID's and/or analgesics, flexibility and strengthening excercises, use of assistive devices, weight reduction as appropriate and activity modification.  Onset of symptoms was gradual starting 2 years ago with rapidlly worsening course since that time.The patient noted no past surgery on the left hip(s).  Patient currently rates pain in the left hip at 10 out of 10 with activity. Patient has night pain, worsening of pain with activity and weight bearing, pain that interfers with activities of daily living, pain with passive range of motion and crepitus. Patient has evidence of subchondral cysts, subchondral sclerosis, periarticular osteophytes and joint space narrowing by imaging studies. This condition presents safety issues increasing the risk of falls. There is no current active infection.  Patient Active Problem List   Diagnosis Date Noted  . Factor V Leiden ([place])   . Prostate cancer ([place]) 12/25/2014  . Essential hypertension, benign 01/05/2014  . Obesity 01/05/2014   Past Medical History:  Diagnosis Date  . Cancer [facility])    prostate cancer  . DVT (deep venous thrombosis) ([place]) 10/2006  . Factor V Leiden ([place])    on chronic treatment  . High frequency hearing loss    more per right ear / wears hearing aides  . Hypertension   . Nocturia   . Pilonidal cyst   . Pneumonia    age 21/viral  . PONV (postoperative nausea and vomiting)    n/v years ago with ether  . Tinnitus    more per right ear  . Urinary tract infection    3 to 4 years ago     Past Surgical History:  Procedure Laterality Date  . colonscopy     x 3  . CYSTOSCOPY WITH STENT  PLACEMENT Left 12/25/2014   Procedure: CYSTOSCOPY WITH STENT PLACEMENT;  Surgeon: Raynelle Bring, MD;  [facility];  Service: Urology;  Laterality: Left;  . EYE SURGERY     lasik bilat   . INGUINAL HERNIA REPAIR N/A 10/31/2015   Procedure: LAPAROSCOPIC BILATERAL INGUINAL HERNIA , LEFT FEMEROL REPAIR WITH MESH AND PRIMARY UMBILICAL HERNIA REPAIR;  Surgeon: Michael Boston, MD;  [facility];  Service: General;  Laterality: N/A;  . LYMPHADENECTOMY Bilateral 12/25/2014   Procedure: Noel Journey;  Surgeon: Raynelle Bring, MD;  [facility];  Service: Urology;  Laterality: Bilateral;  . pilonidal cyst removal  1970's  . ROBOT ASSISTED LAPAROSCOPIC RADICAL PROSTATECTOMY N/A 12/25/2014   Procedure: ROBOTIC ASSISTED LAPAROSCOPIC RADICAL PROSTATECTOMY LEVEL 2;  Surgeon: Raynelle Bring, MD;  [facility];  Service: Urology;  Laterality: N/A;  . ROBOTIC ASSISTED LAPAROSCOPIC BLADDER DIVERTICULECTOMY N/A 12/25/2014   Procedure: ROBOTIC ASSISTED LAPAROSCOPIC BLADDER DIVERTICULECTOMY;  Surgeon: Raynelle Bring, MD;  [facility];  Service: Urology;  Laterality: N/A;  . TONSILLECTOMY  as child  . UMBILICAL HERNIA REPAIR  10/31/2015   Procedure: HERNIA REPAIR UMBILICAL ADULT;  Surgeon: Michael Boston, MD;  [facility];  Service: General;;  . wisdom ttooth extraction  1978     (Not in a hospital admission) No Known Allergies  Social History  Substance Use Topics  . Smoking status: Never Smoker  . Smokeless tobacco: Never Used  . Alcohol use No  Family History  Problem Relation Age of Onset  . Pulmonary embolism Father      Review of Systems  Constitutional: Negative.   HENT: Negative.   Eyes: Negative.   Respiratory: Negative.   Cardiovascular: Negative.   Gastrointestinal: Negative.   Genitourinary: Negative.   Musculoskeletal: Positive for joint pain.  Skin: Negative.   Neurological: Negative.   Endo/Heme/Allergies: Negative.   Psychiatric/Behavioral: Negative.      Objective:  Physical Exam  Vital signs in last 24 hours: @VSRANGES @  Labs:   Estimated body mass index is 35.62 kg/m as calculated from the following:   Height as of 04/16/16: 6\' 1"  (1.854 m).   Weight as of 04/16/16: 122.5 kg (270 lb).   Imaging Review [place] radiographs demonstrate severe degenerative joint disease of the left hip(s). The bone quality appears to be satisfactory for age and reported activity level.  Assessment/Plan:  End stage arthritis, left hip(s)  The patient history, physical examination, clinical judgement of the provider and imaging studies are consistent with end stage degenerative joint disease of the left hip(s) and total hip arthroplasty is deemed medically necessary. The treatment options including medical management, injection therapy, arthroscopy and arthroplasty were discussed at length. The risks and benefits of total hip arthroplasty were presented and reviewed. The risks due to aseptic loosening, infection, stiffness, dislocation/subluxation,  thromboembolic complications and other imponderables were discussed.  The patient acknowledged the explanation, agreed to proceed with the plan and consent was signed. Patient is being admitted for inpatient treatment for surgery, pain control, PT, OT, prophylactic antibiotics, VTE prophylaxis, progressive ambulation and ADL's and discharge planning.The patient is planning to be discharged home with home health services. H/o Factor V Leiden - coumadin with lovenox bridge

## 2016-08-21 NOTE — Patient Instructions (Addendum)
Anthony Mckinney  08/21/2016   Your procedure is scheduled on: Thursday 09/04/2016  Report to [facility]  Entrance take [facility]  elevators to 3rd floor to  [place] at    1045   AM.  Call this number if you have problems the morning of surgery [phone]   Remember: ONLY 1 PERSON MAY GO WITH YOU TO SHORT STAY TO GET  READY MORNING OF [name].   Do not eat food or drink liquids :After Midnight.     Take these medicines the morning of surgery with A SIP OF WATER: none                                 You may not have any metal on your body including hair pins and              piercings  Do not wear jewelry, make-up, lotions, powders or perfumes, deodorant             Do not wear nail polish.  Do not shave  48 hours prior to surgery.              Men may shave face and neck.   Do not bring valuables to the hospital. [place].  Contacts, dentures or bridgework may not be worn into surgery.  Leave suitcase in the car. After surgery it may be brought to your room.                  Please read over the following fact sheets you were given: _____________________________________________________________________             [facility] - Preparing for Surgery Before surgery, you can play an important role.  Because skin is not sterile, your skin needs to be as free of germs as possible.  You can reduce the number of germs on your skin by washing with CHG (chlorahexidine gluconate) soap before surgery.  CHG is an antiseptic cleaner which kills germs and bonds with the skin to continue killing germs even after washing. Please DO NOT use if you have an allergy to CHG or antibacterial soaps.  If your skin becomes reddened/irritated stop using the CHG and inform your nurse when you arrive at Short Stay. Do not shave (including legs and underarms) for at least 48 hours prior to the first CHG shower.  You  may shave your face/neck. Please follow these instructions carefully:  1.  Shower with CHG Soap the night before surgery and the  morning of Surgery.  2.  If you choose to wash your hair, wash your hair first as usual with your  normal  shampoo.  3.  After you shampoo, rinse your hair and body thoroughly to remove the  shampoo.                           4.  Use CHG as you would any other liquid soap.  You can apply chg directly  to the skin and wash                       Gently with a scrungie or clean  washcloth.  5.  Apply the CHG Soap to your body ONLY FROM THE NECK DOWN.   Do not use on face/ open                           Wound or open sores. Avoid contact with eyes, ears mouth and genitals (private parts).                       Wash face,  Genitals (private parts) with your normal soap.             6.  Wash thoroughly, paying special attention to the area where your surgery  will be performed.  7.  Thoroughly rinse your body with warm water from the neck down.  8.  DO NOT shower/wash with your normal soap after using and rinsing off  the CHG Soap.                9.  Pat yourself dry with a clean towel.            10.  Wear clean pajamas.            11.  Place clean sheets on your bed the night of your first shower and do not  sleep with pets. Day of Surgery : Do not apply any lotions/deodorants the morning of surgery.  Please wear clean clothes to the hospital/surgery center.  FAILURE TO FOLLOW THESE INSTRUCTIONS MAY RESULT IN THE CANCELLATION OF YOUR SURGERY PATIENT SIGNATURE_________________________________  NURSE SIGNATURE__________________________________  ________________________________________________________________________   Anthony Mckinney  An incentive spirometer is a tool that can help keep your lungs clear and active. This tool measures how well you are filling your lungs with each breath. Taking long deep breaths may help reverse or decrease the chance of  developing breathing (pulmonary) problems (especially infection) following:  A long period of time when you are unable to move or be active. BEFORE THE PROCEDURE   If the spirometer includes an indicator to show your best effort, your nurse or respiratory therapist will set it to a desired goal.  If possible, sit up straight or lean slightly forward. Try not to slouch.  Hold the incentive spirometer in an upright position. INSTRUCTIONS FOR USE  1. Sit on the edge of your bed if possible, or sit up as far as you can in bed or on a chair. 2. Hold the incentive spirometer in an upright position. 3. Breathe out normally. 4. Place the mouthpiece in your mouth and seal your lips tightly around it. 5. Breathe in slowly and as deeply as possible, raising the piston or the ball toward the top of the column. 6. Hold your breath for 3-5 seconds or for as long as possible. Allow the piston or ball to fall to the bottom of the column. 7. Remove the mouthpiece from your mouth and breathe out normally. 8. Rest for a few seconds and repeat Steps 1 through 7 at least 10 times every 1-2 hours when you are awake. Take your time and take a few normal breaths between deep breaths. 9. The spirometer may include an indicator to show your best effort. Use the indicator as a goal to work toward during each repetition. 10. After each set of 10 deep breaths, practice coughing to be sure your lungs are clear. If you have an incision (the cut made at the time of surgery), support your incision when coughing by  placing a pillow or rolled up towels firmly against it. Once you are able to get out of bed, walk around indoors and cough well. You may stop using the incentive spirometer when instructed by your caregiver.  RISKS AND COMPLICATIONS  Take your time so you do not get dizzy or light-headed.  If you are in pain, you may need to take or ask for pain medication before doing incentive spirometry. It is harder to take a  deep breath if you are having pain. AFTER USE  Rest and breathe slowly and easily.  It can be helpful to keep track of a log of your progress. Your caregiver can provide you with a simple table to help with this. If you are using the spirometer at home, follow these instructions: [place] IF:   You are having difficultly using the spirometer.  You have trouble using the spirometer as often as instructed.  Your pain medication is not giving enough relief while using the spirometer.  You develop fever of 100.5 F (38.1 C) or higher. SEEK IMMEDIATE MEDICAL CARE IF:   You cough up bloody sputum that had not been present before.  You develop fever of 102 F (38.9 C) or greater.  You develop worsening pain at or near the incision site. MAKE SURE YOU:   Understand these instructions.  Will watch your condition.  Will get help right away if you are not doing well or get worse. Document Released: 03/30/2007 Document Revised: 02/09/2012 Document Reviewed: 05/31/2007 ExitCare Patient Information 2014 ExitCare, [place].   ________________________________________________________________________  WHAT IS A BLOOD TRANSFUSION? Blood Transfusion Information  A transfusion is the replacement of blood or some of its parts. Blood is made up of multiple cells which provide different functions.  Red blood cells carry oxygen and are used for blood loss replacement.  White blood cells fight against infection.  Platelets control bleeding.  Plasma helps clot blood.  Other blood products are available for specialized needs, such as hemophilia or other clotting disorders. BEFORE THE TRANSFUSION  Who gives blood for transfusions?   Healthy volunteers who are fully evaluated to make sure their blood is safe. This is blood bank blood. Transfusion therapy is the safest it has ever been in the practice of medicine. Before blood is taken from a donor, a complete history is taken to make  sure that person has no history of diseases nor engages in risky social behavior (examples are intravenous drug use or sexual activity with multiple partners). The donor's travel history is screened to minimize risk of transmitting infections, such as malaria. The donated blood is tested for signs of infectious diseases, such as HIV and hepatitis. The blood is then tested to be sure it is compatible with you in order to minimize the chance of a transfusion reaction. If you or a relative donates blood, this is often done in anticipation of surgery and is not appropriate for emergency situations. It takes many days to process the donated blood. RISKS AND COMPLICATIONS Although transfusion therapy is very safe and saves many lives, the main dangers of transfusion include:   Getting an infectious disease.  Developing a transfusion reaction. This is an allergic reaction to something in the blood you were given. Every precaution is taken to prevent this. The decision to have a blood transfusion has been considered carefully by your caregiver before blood is given. Blood is not given unless the benefits outweigh the risks. AFTER THE TRANSFUSION  Right after receiving a blood  transfusion, you will usually feel much better and more energetic. This is especially true if your red blood cells have gotten low (anemic). The transfusion raises the level of the red blood cells which carry oxygen, and this usually causes an energy increase.  The nurse administering the transfusion will monitor you carefully for complications. HOME CARE INSTRUCTIONS  No special instructions are needed after a transfusion. You may find your energy is better. Speak with your caregiver about any limitations on activity for underlying diseases you may have. SEEK MEDICAL CARE IF:   Your condition is not improving after your transfusion.  You develop redness or irritation at the intravenous (IV) site. SEEK IMMEDIATE MEDICAL CARE IF:   Any of the following symptoms occur over the next 12 hours:  Shaking chills.  You have a temperature by mouth above 102 F (38.9 C), not controlled by medicine.  Chest, back, or muscle pain.  People around you feel you are not acting correctly or are confused.  Shortness of breath or difficulty breathing.  Dizziness and fainting.  You get a rash or develop hives.  You have a decrease in urine output.  Your urine turns a dark color or changes to pink, red, or brown. Any of the following symptoms occur over the next 10 days:  You have a temperature by mouth above 102 F (38.9 C), not controlled by medicine.  Shortness of breath.  Weakness after normal activity.  The white part of the eye turns yellow (jaundice).  You have a decrease in the amount of urine or are urinating less often.  Your urine turns a dark color or changes to pink, red, or brown. Document Released: 11/14/2000 Document Revised: 02/09/2012 Document Reviewed: 07/03/2008 [facility] Patient Information 2014 [place], [place].  _______________________________________________________________________

## 2016-08-25 ENCOUNTER — Encounter (HOSPITAL_COMMUNITY)
Admission: RE | Admit: 2016-08-25 | Discharge: 2016-08-25 | Disposition: A | Discharge status: Home / Self Care | Payer: BLUE CROSS/BLUE SHIELD | Source: Ambulatory Visit | Attending: Orthopedic Surgery | Admitting: Orthopedic Surgery

## 2016-08-25 ENCOUNTER — Encounter (HOSPITAL_COMMUNITY): Payer: Self-pay

## 2016-08-25 DIAGNOSIS — D6851 Activated protein C resistance: Secondary | ICD-10-CM | POA: Diagnosis not present

## 2016-08-25 DIAGNOSIS — Z86718 Personal history of other venous thrombosis and embolism: Secondary | ICD-10-CM | POA: Insufficient documentation

## 2016-08-25 DIAGNOSIS — C61 Malignant neoplasm of prostate: Secondary | ICD-10-CM | POA: Diagnosis not present

## 2016-08-25 DIAGNOSIS — Z01812 Encounter for preprocedural laboratory examination: Secondary | ICD-10-CM | POA: Insufficient documentation

## 2016-08-25 DIAGNOSIS — H9311 Tinnitus, right ear: Secondary | ICD-10-CM | POA: Insufficient documentation

## 2016-08-25 DIAGNOSIS — I10 Essential (primary) hypertension: Secondary | ICD-10-CM | POA: Insufficient documentation

## 2016-08-25 DIAGNOSIS — M1612 Unilateral primary osteoarthritis, left hip: Secondary | ICD-10-CM | POA: Diagnosis not present

## 2016-08-25 LAB — BASIC METABOLIC PANEL
Anion gap: 6 (ref 5–15)
BUN: 20 mg/dL (ref 6–20)
CALCIUM: 9.1 mg/dL (ref 8.9–10.3)
CO2: 29 mmol/L (ref 22–32)
CREATININE: 1.2 mg/dL (ref 0.61–1.24)
Chloride: 99 mmol/L — ABNORMAL LOW (ref 101–111)
GFR calc non Af Amer: >60 mL/min (ref >60)
Glucose, Bld: 75 mg/dL (ref 65–99)
Potassium: 4 mmol/L (ref 3.5–5.1)
SODIUM: 134 mmol/L — AB (ref 135–145)

## 2016-08-25 LAB — CBC
HCT: 45.1 % (ref 39.0–52.0)
Hemoglobin: 15.3 g/dL (ref 13.0–17.0)
MCH: 31.1 pg (ref 26.0–34.0)
MCHC: 33.9 g/dL (ref 30.0–36.0)
MCV: 91.7 fL (ref 78.0–100.0)
Platelets: 284 10*3/uL (ref 150–400)
RBC: 4.92 MIL/uL (ref 4.22–5.81)
RDW: 13.7 % (ref 11.5–15.5)
WBC: 7 10*3/uL (ref 4.0–10.5)

## 2016-08-25 LAB — SURGICAL PCR SCREEN
MRSA, PCR: NEGATIVE
Staphylococcus aureus: NEGATIVE

## 2016-08-25 NOTE — Progress Notes (Signed)
07/24/2016- Pre-operative clearance from Dr. Melinda Crutch on chart.  04/16/2016- noted EKG in EPIC.

## 2016-09-04 ENCOUNTER — Inpatient Hospital Stay (HOSPITAL_COMMUNITY): Payer: BLUE CROSS/BLUE SHIELD

## 2016-09-04 ENCOUNTER — Inpatient Hospital Stay (HOSPITAL_COMMUNITY)
Admission: RE | Admit: 2016-09-04 | Discharge: 2016-09-06 | DRG: 470 | Disposition: A | Discharge status: Home Health | Payer: BLUE CROSS/BLUE SHIELD | Source: Ambulatory Visit | Attending: Orthopedic Surgery | Admitting: Orthopedic Surgery

## 2016-09-04 ENCOUNTER — Inpatient Hospital Stay (HOSPITAL_COMMUNITY): Payer: BLUE CROSS/BLUE SHIELD | Admitting: Certified Registered Nurse Anesthetist

## 2016-09-04 ENCOUNTER — Encounter (HOSPITAL_COMMUNITY): Payer: Self-pay | Admitting: Anesthesiology

## 2016-09-04 ENCOUNTER — Encounter (HOSPITAL_COMMUNITY)
Admission: RE | Disposition: A | Discharge status: Home Health | Payer: Self-pay | Source: Ambulatory Visit | Attending: Orthopedic Surgery

## 2016-09-04 DIAGNOSIS — Z9079 Acquired absence of other genital organ(s): Secondary | ICD-10-CM | POA: Diagnosis not present

## 2016-09-04 DIAGNOSIS — Z974 Presence of external hearing-aid: Secondary | ICD-10-CM | POA: Diagnosis not present

## 2016-09-04 DIAGNOSIS — Z86718 Personal history of other venous thrombosis and embolism: Secondary | ICD-10-CM | POA: Diagnosis not present

## 2016-09-04 DIAGNOSIS — M25559 Pain in unspecified hip: Secondary | ICD-10-CM

## 2016-09-04 DIAGNOSIS — M25552 Pain in left hip: Secondary | ICD-10-CM | POA: Diagnosis present

## 2016-09-04 DIAGNOSIS — I10 Essential (primary) hypertension: Secondary | ICD-10-CM | POA: Diagnosis present

## 2016-09-04 DIAGNOSIS — Z09 Encounter for follow-up examination after completed treatment for conditions other than malignant neoplasm: Secondary | ICD-10-CM

## 2016-09-04 DIAGNOSIS — Z79899 Other long term (current) drug therapy: Secondary | ICD-10-CM

## 2016-09-04 DIAGNOSIS — Z8546 Personal history of malignant neoplasm of prostate: Secondary | ICD-10-CM

## 2016-09-04 DIAGNOSIS — H918X1 Other specified hearing loss, right ear: Secondary | ICD-10-CM | POA: Diagnosis present

## 2016-09-04 DIAGNOSIS — D6851 Activated protein C resistance: Secondary | ICD-10-CM | POA: Diagnosis present

## 2016-09-04 DIAGNOSIS — M1612 Unilateral primary osteoarthritis, left hip: Secondary | ICD-10-CM | POA: Diagnosis present

## 2016-09-04 DIAGNOSIS — Z7901 Long term (current) use of anticoagulants: Secondary | ICD-10-CM | POA: Diagnosis not present

## 2016-09-04 HISTORY — PX: TOTAL HIP ARTHROPLASTY: SHX124

## 2016-09-04 LAB — CBC
HCT: 39.4 % (ref 39.0–52.0)
HEMOGLOBIN: 13.3 g/dL (ref 13.0–17.0)
MCH: 31 pg (ref 26.0–34.0)
MCHC: 33.8 g/dL (ref 30.0–36.0)
MCV: 91.8 fL (ref 78.0–100.0)
Platelets: 211 10*3/uL (ref 150–400)
RBC: 4.29 MIL/uL (ref 4.22–5.81)
RDW: 13.7 % (ref 11.5–15.5)
WBC: 13.7 10*3/uL — ABNORMAL HIGH (ref 4.0–10.5)

## 2016-09-04 LAB — PROTIME-INR
INR: 1
Prothrombin Time: 13.2 seconds (ref 11.4–15.2)

## 2016-09-04 LAB — CREATININE, SERUM
CREATININE: 1.08 mg/dL (ref 0.61–1.24)
GFR calc Af Amer: >60 mL/min (ref >60)
GFR calc non Af Amer: >60 mL/min (ref >60)

## 2016-09-04 LAB — TYPE AND SCREEN
ABO/RH(D): A POS
ANTIBODY SCREEN: NEGATIVE

## 2016-09-04 SURGERY — ARTHROPLASTY, HIP, TOTAL, ANTERIOR APPROACH
Anesthesia: General | Site: Hip | Laterality: Left

## 2016-09-04 MED ORDER — TRAZODONE HCL 50 MG PO TABS: 50.0000 mg | ORAL_TABLET | Freq: Every evening | ORAL | Status: DC | PRN

## 2016-09-04 MED ORDER — SENNA 8.6 MG PO TABS
2.0000 | ORAL_TABLET | Freq: Every day | ORAL | Status: DC
  Administered 2016-09-04 – 2016-09-05 (×2): 17.2 mg via ORAL
  Filled 2016-09-04 (×2): qty 2

## 2016-09-04 MED ORDER — ACETAMINOPHEN 10 MG/ML IV SOLN
INTRAVENOUS | Status: AC
  Filled 2016-09-04: qty 100

## 2016-09-04 MED ORDER — HYDROCODONE-ACETAMINOPHEN 5-325 MG PO TABS
1.0000 | ORAL_TABLET | ORAL | Status: DC | PRN
  Administered 2016-09-04 – 2016-09-05 (×4): 1 via ORAL
  Administered 2016-09-05 – 2016-09-06 (×4): 2 via ORAL
  Filled 2016-09-04: qty 2
  Filled 2016-09-04: qty 1
  Filled 2016-09-04 (×2): qty 2
  Filled 2016-09-04: qty 1
  Filled 2016-09-04 (×2): qty 2
  Filled 2016-09-04: qty 1

## 2016-09-04 MED ORDER — ENOXAPARIN SODIUM 40 MG/0.4ML ~~LOC~~ SOLN
40.0000 mg | SUBCUTANEOUS | Status: DC
  Administered 2016-09-05 – 2016-09-06 (×2): 40 mg via SUBCUTANEOUS
  Filled 2016-09-04 (×2): qty 0.4

## 2016-09-04 MED ORDER — ISOPROPYL ALCOHOL 70 % SOLN
Status: DC | PRN
  Administered 2016-09-04: 1 via TOPICAL

## 2016-09-04 MED ORDER — PHENYLEPHRINE 40 MCG/ML (10ML) SYRINGE FOR IV PUSH (FOR BLOOD PRESSURE SUPPORT)
PREFILLED_SYRINGE | INTRAVENOUS | Status: AC
  Filled 2016-09-04: qty 10

## 2016-09-04 MED ORDER — METOCLOPRAMIDE HCL 5 MG/ML IJ SOLN: 5.0000 mg | Freq: Three times a day (TID) | INTRAMUSCULAR | Status: DC | PRN

## 2016-09-04 MED ORDER — LOSARTAN POTASSIUM-HCTZ 50-12.5 MG PO TABS: 1.0000 | ORAL_TABLET | Freq: Every day | ORAL | Status: DC

## 2016-09-04 MED ORDER — METHOCARBAMOL 1000 MG/10ML IJ SOLN
500.0000 mg | Freq: Four times a day (QID) | INTRAVENOUS | Status: DC | PRN
  Administered 2016-09-04: 500 mg via INTRAVENOUS
  Filled 2016-09-04: qty 550
  Filled 2016-09-04: qty 5

## 2016-09-04 MED ORDER — MEPERIDINE HCL 50 MG/ML IJ SOLN: 6.2500 mg | INTRAMUSCULAR | Status: DC | PRN

## 2016-09-04 MED ORDER — HYDROMORPHONE HCL 1 MG/ML IJ SOLN
0.5000 mg | INTRAMUSCULAR | Status: DC | PRN
  Administered 2016-09-04 – 2016-09-05 (×2): 1 mg via INTRAVENOUS
  Filled 2016-09-04 (×2): qty 1

## 2016-09-04 MED ORDER — TRANEXAMIC ACID 1000 MG/10ML IV SOLN
INTRAVENOUS | Status: DC | PRN
  Administered 2016-09-04: 2000 mg via TOPICAL

## 2016-09-04 MED ORDER — SODIUM CHLORIDE 0.9 % IJ SOLN
INTRAMUSCULAR | Status: AC
  Filled 2016-09-04: qty 50

## 2016-09-04 MED ORDER — HYDROMORPHONE HCL 1 MG/ML IJ SOLN
INTRAMUSCULAR | Status: AC
  Administered 2016-09-04: 0.5 mg via INTRAVENOUS
  Filled 2016-09-04: qty 1

## 2016-09-04 MED ORDER — DEXAMETHASONE SODIUM PHOSPHATE 10 MG/ML IJ SOLN
INTRAMUSCULAR | Status: AC
  Filled 2016-09-04: qty 2

## 2016-09-04 MED ORDER — KETOROLAC TROMETHAMINE 30 MG/ML IJ SOLN
INTRAMUSCULAR | Status: DC | PRN
  Administered 2016-09-04: 30 mg via INTRA_ARTICULAR

## 2016-09-04 MED ORDER — BUPIVACAINE HCL (PF) 0.25 % IJ SOLN
INTRAMUSCULAR | Status: AC
  Filled 2016-09-04: qty 30

## 2016-09-04 MED ORDER — FENTANYL CITRATE (PF) 250 MCG/5ML IJ SOLN
INTRAMUSCULAR | Status: AC
  Filled 2016-09-04: qty 5

## 2016-09-04 MED ORDER — MIDAZOLAM HCL 5 MG/5ML IJ SOLN
INTRAMUSCULAR | Status: DC | PRN
  Administered 2016-09-04: 2 mg via INTRAVENOUS

## 2016-09-04 MED ORDER — FENTANYL CITRATE (PF) 100 MCG/2ML IJ SOLN
INTRAMUSCULAR | Status: AC
  Filled 2016-09-04: qty 2

## 2016-09-04 MED ORDER — HYDROGEN PEROXIDE 3 % EX SOLN
CUTANEOUS | Status: DC | PRN
  Administered 2016-09-04: 1

## 2016-09-04 MED ORDER — MELATONIN 10 MG PO TABS
20.0000 mg | ORAL_TABLET | Freq: Every evening | ORAL | Status: DC | PRN
Start: 2016-09-04 — End: 2016-09-04

## 2016-09-04 MED ORDER — PHENOL 1.4 % MT LIQD: 1.0000 | OROMUCOSAL | Status: DC | PRN

## 2016-09-04 MED ORDER — SODIUM CHLORIDE 0.9 % IR SOLN
Status: DC | PRN
  Administered 2016-09-04: 1000 mL

## 2016-09-04 MED ORDER — PROPOFOL 10 MG/ML IV BOLUS
INTRAVENOUS | Status: DC | PRN
  Administered 2016-09-04: 180 mg via INTRAVENOUS

## 2016-09-04 MED ORDER — CHLORHEXIDINE GLUCONATE 4 % EX LIQD: 60.0000 mL | Freq: Once | CUTANEOUS | Status: DC

## 2016-09-04 MED ORDER — PROMETHAZINE HCL 25 MG/ML IJ SOLN: 6.2500 mg | INTRAMUSCULAR | Status: DC | PRN

## 2016-09-04 MED ORDER — POVIDONE-IODINE 10 % EX SWAB
2.0000 "application " | Freq: Once | CUTANEOUS | Status: AC
  Administered 2016-09-04: 2 via TOPICAL

## 2016-09-04 MED ORDER — MENTHOL 3 MG MT LOZG: 1.0000 | LOZENGE | OROMUCOSAL | Status: DC | PRN

## 2016-09-04 MED ORDER — ACETAMINOPHEN 10 MG/ML IV SOLN
1000.0000 mg | INTRAVENOUS | Status: AC
  Administered 2016-09-04: 1000 mg via INTRAVENOUS

## 2016-09-04 MED ORDER — DOCUSATE SODIUM 100 MG PO CAPS
100.0000 mg | ORAL_CAPSULE | Freq: Two times a day (BID) | ORAL | Status: DC
  Administered 2016-09-04 – 2016-09-06 (×4): 100 mg via ORAL
  Filled 2016-09-04 (×4): qty 1

## 2016-09-04 MED ORDER — KETOROLAC TROMETHAMINE 30 MG/ML IJ SOLN
INTRAMUSCULAR | Status: AC
  Filled 2016-09-04: qty 1

## 2016-09-04 MED ORDER — ISOPROPYL ALCOHOL 70 % SOLN
Status: AC
  Filled 2016-09-04: qty 480

## 2016-09-04 MED ORDER — ONDANSETRON HCL 4 MG PO TABS: 4.0000 mg | ORAL_TABLET | Freq: Four times a day (QID) | ORAL | Status: DC | PRN

## 2016-09-04 MED ORDER — LOSARTAN POTASSIUM 50 MG PO TABS
50.0000 mg | ORAL_TABLET | Freq: Every day | ORAL | Status: DC
  Administered 2016-09-04 – 2016-09-06 (×3): 50 mg via ORAL
  Filled 2016-09-04 (×2): qty 1

## 2016-09-04 MED ORDER — FENTANYL CITRATE (PF) 100 MCG/2ML IJ SOLN
INTRAMUSCULAR | Status: DC | PRN
Start: 2016-09-04 — End: 2016-09-04
  Administered 2016-09-04 (×2): 50 ug via INTRAVENOUS
  Administered 2016-09-04: 100 ug via INTRAVENOUS
  Administered 2016-09-04 (×2): 50 ug via INTRAVENOUS
  Administered 2016-09-04: 100 ug via INTRAVENOUS
  Administered 2016-09-04 (×6): 50 ug via INTRAVENOUS

## 2016-09-04 MED ORDER — DEXAMETHASONE SODIUM PHOSPHATE 10 MG/ML IJ SOLN
INTRAMUSCULAR | Status: DC | PRN
  Administered 2016-09-04: 10 mg via INTRAVENOUS

## 2016-09-04 MED ORDER — DEXTROSE 5 % IV SOLN
3.0000 g | INTRAVENOUS | Status: AC
  Administered 2016-09-04: 3 g via INTRAVENOUS
  Filled 2016-09-04: qty 3

## 2016-09-04 MED ORDER — PROPOFOL 10 MG/ML IV BOLUS
INTRAVENOUS | Status: AC
  Filled 2016-09-04: qty 20

## 2016-09-04 MED ORDER — DEXAMETHASONE SODIUM PHOSPHATE 10 MG/ML IJ SOLN
10.0000 mg | Freq: Once | INTRAMUSCULAR | Status: AC
  Administered 2016-09-05: 10 mg via INTRAVENOUS
  Filled 2016-09-04: qty 1

## 2016-09-04 MED ORDER — LIDOCAINE HCL (CARDIAC) 20 MG/ML IV SOLN
INTRAVENOUS | Status: DC | PRN
  Administered 2016-09-04: 100 mg via INTRAVENOUS

## 2016-09-04 MED ORDER — KETOROLAC TROMETHAMINE 15 MG/ML IJ SOLN
15.0000 mg | Freq: Four times a day (QID) | INTRAMUSCULAR | Status: AC
  Administered 2016-09-04 – 2016-09-05 (×4): 15 mg via INTRAVENOUS
  Filled 2016-09-04 (×4): qty 1

## 2016-09-04 MED ORDER — DEXAMETHASONE SODIUM PHOSPHATE 10 MG/ML IJ SOLN
INTRAMUSCULAR | Status: AC
  Filled 2016-09-04: qty 1

## 2016-09-04 MED ORDER — SODIUM CHLORIDE 0.9 % IJ SOLN
INTRAMUSCULAR | Status: DC | PRN
  Administered 2016-09-04: 30 mL

## 2016-09-04 MED ORDER — HYDROCHLOROTHIAZIDE 12.5 MG PO CAPS
12.5000 mg | ORAL_CAPSULE | Freq: Every day | ORAL | Status: DC
  Administered 2016-09-04 – 2016-09-06 (×3): 12.5 mg via ORAL
  Filled 2016-09-04 (×3): qty 1

## 2016-09-04 MED ORDER — TRANEXAMIC ACID 1000 MG/10ML IV SOLN
2000.0000 mg | INTRAVENOUS | Status: DC
  Filled 2016-09-04: qty 20

## 2016-09-04 MED ORDER — HYDROMORPHONE HCL 1 MG/ML IJ SOLN
0.2500 mg | INTRAMUSCULAR | Status: DC | PRN
  Administered 2016-09-04 (×2): 0.5 mg via INTRAVENOUS

## 2016-09-04 MED ORDER — HYDROGEN PEROXIDE 3 % EX SOLN
CUTANEOUS | Status: AC
Start: 2016-09-04 — End: 2016-09-04
  Filled 2016-09-04: qty 473

## 2016-09-04 MED ORDER — LACTATED RINGERS IV SOLN: INTRAVENOUS | Status: DC

## 2016-09-04 MED ORDER — METHOCARBAMOL 500 MG PO TABS
500.0000 mg | ORAL_TABLET | Freq: Four times a day (QID) | ORAL | Status: DC | PRN
  Administered 2016-09-05 (×2): 500 mg via ORAL
  Filled 2016-09-04 (×2): qty 1

## 2016-09-04 MED ORDER — ONDANSETRON HCL 4 MG/2ML IJ SOLN
INTRAMUSCULAR | Status: AC
  Filled 2016-09-04: qty 2

## 2016-09-04 MED ORDER — MIDAZOLAM HCL 2 MG/2ML IJ SOLN
INTRAMUSCULAR | Status: AC
  Filled 2016-09-04: qty 2

## 2016-09-04 MED ORDER — EPHEDRINE 5 MG/ML INJ
INTRAVENOUS | Status: AC
  Filled 2016-09-04: qty 10

## 2016-09-04 MED ORDER — LIDOCAINE 2% (20 MG/ML) 5 ML SYRINGE
INTRAMUSCULAR | Status: AC
  Filled 2016-09-04: qty 10

## 2016-09-04 MED ORDER — CEFAZOLIN SODIUM-DEXTROSE 2-4 GM/100ML-% IV SOLN
2.0000 g | Freq: Four times a day (QID) | INTRAVENOUS | Status: AC
  Administered 2016-09-04 – 2016-09-05 (×2): 2 g via INTRAVENOUS
  Filled 2016-09-04 (×2): qty 100

## 2016-09-04 MED ORDER — ONDANSETRON HCL 4 MG/2ML IJ SOLN
INTRAMUSCULAR | Status: DC | PRN
  Administered 2016-09-04: 4 mg via INTRAVENOUS

## 2016-09-04 MED ORDER — PHENYLEPHRINE HCL 10 MG/ML IJ SOLN
INTRAMUSCULAR | Status: DC | PRN
  Administered 2016-09-04 (×2): 80 ug via INTRAVENOUS

## 2016-09-04 MED ORDER — LACTATED RINGERS IV SOLN
INTRAVENOUS | Status: DC
  Administered 2016-09-04 (×4): via INTRAVENOUS

## 2016-09-04 MED ORDER — ACETAMINOPHEN 325 MG PO TABS: 650.0000 mg | ORAL_TABLET | Freq: Four times a day (QID) | ORAL | Status: DC | PRN

## 2016-09-04 MED ORDER — LIDOCAINE 2% (20 MG/ML) 5 ML SYRINGE
INTRAMUSCULAR | Status: AC
  Filled 2016-09-04: qty 5

## 2016-09-04 MED ORDER — ACETAMINOPHEN 650 MG RE SUPP: 650.0000 mg | Freq: Four times a day (QID) | RECTAL | Status: DC | PRN

## 2016-09-04 MED ORDER — BUPIVACAINE HCL 0.25 % IJ SOLN
INTRAMUSCULAR | Status: DC | PRN
  Administered 2016-09-04: 30 mL

## 2016-09-04 MED ORDER — POLYETHYLENE GLYCOL 3350 17 G PO PACK: 17.0000 g | PACK | Freq: Every day | ORAL | Status: DC | PRN

## 2016-09-04 MED ORDER — ADULT MULTIVITAMIN W/MINERALS CH
1.0000 | ORAL_TABLET | Freq: Every day | ORAL | Status: DC
  Administered 2016-09-05 – 2016-09-06 (×2): 1 via ORAL
  Filled 2016-09-04 (×2): qty 1

## 2016-09-04 MED ORDER — SODIUM CHLORIDE 0.9 % IV SOLN: INTRAVENOUS | Status: DC

## 2016-09-04 MED ORDER — WATER FOR IRRIGATION, STERILE IR SOLN
Status: DC | PRN
  Administered 2016-09-04: 2000 mL via SURGICAL_CAVITY

## 2016-09-04 MED ORDER — SODIUM CHLORIDE 0.9 % IV SOLN
INTRAVENOUS | Status: DC
  Administered 2016-09-04: 150 mL/h via INTRAVENOUS

## 2016-09-04 MED ORDER — METOCLOPRAMIDE HCL 5 MG PO TABS: 5.0000 mg | ORAL_TABLET | Freq: Three times a day (TID) | ORAL | Status: DC | PRN

## 2016-09-04 MED ORDER — ONDANSETRON HCL 4 MG/2ML IJ SOLN: 4.0000 mg | Freq: Four times a day (QID) | INTRAMUSCULAR | Status: DC | PRN

## 2016-09-04 SURGICAL SUPPLY — 44 items
ADH SKN CLS APL DERMABOND .7 (GAUZE/BANDAGES/DRESSINGS) ×1
BAG DECANTER FOR FLEXI CONT (MISCELLANEOUS) IMPLANT
BAG SPEC THK2 15X12 ZIP CLS (MISCELLANEOUS)
BAG ZIPLOCK 12X15 (MISCELLANEOUS) IMPLANT
CAPT HIP TOTAL 2 ×2 IMPLANT
CHLORAPREP W/TINT 26ML (MISCELLANEOUS) ×2 IMPLANT
CLOTH BEACON ORANGE TIMEOUT ST (SAFETY) ×2 IMPLANT
COVER PERINEAL POST (MISCELLANEOUS) ×2 IMPLANT
DECANTER SPIKE VIAL GLASS SM (MISCELLANEOUS) ×2 IMPLANT
DERMABOND ADVANCED (GAUZE/BANDAGES/DRESSINGS) ×1
DERMABOND ADVANCED .7 DNX12 (GAUZE/BANDAGES/DRESSINGS) ×1 IMPLANT
DRAPE SHEET LG 3/4 BI-LAMINATE (DRAPES) ×6 IMPLANT
DRAPE STERI IOBAN 125X83 (DRAPES) ×2 IMPLANT
DRAPE U-SHAPE 47X51 STRL (DRAPES) ×4 IMPLANT
DRSG AQUACEL AG ADV 3.5X10 (GAUZE/BANDAGES/DRESSINGS) ×2 IMPLANT
ELECT PENCIL ROCKER SW 15FT (MISCELLANEOUS) ×2 IMPLANT
ELECT REM PT RETURN 15FT ADLT (MISCELLANEOUS) ×2 IMPLANT
GAUZE SPONGE 4X4 12PLY STRL (GAUZE/BANDAGES/DRESSINGS) ×2 IMPLANT
GLOVE BIO SURGEON STRL SZ8.5 (GLOVE) ×4 IMPLANT
GLOVE BIOGEL PI IND STRL 8.5 (GLOVE) ×1 IMPLANT
GLOVE BIOGEL PI INDICATOR 8.5 (GLOVE) ×1
GOWN SPEC L3 XXLG W/TWL (GOWN DISPOSABLE) ×2 IMPLANT
HANDPIECE INTERPULSE COAX TIP (DISPOSABLE) ×1
HOLDER FOLEY CATH W/STRAP (MISCELLANEOUS) ×2 IMPLANT
HOOD PEEL AWAY FLYTE STAYCOOL (MISCELLANEOUS) ×4 IMPLANT
MARKER SKIN DUAL TIP RULER LAB (MISCELLANEOUS) ×2 IMPLANT
NEEDLE SPNL 18GX3.5 QUINCKE PK (NEEDLE) ×2 IMPLANT
PACK ANTERIOR HIP CUSTOM (KITS) ×2 IMPLANT
SAW OSC TIP CART 19.5X105X1.3 (SAW) ×2 IMPLANT
SEALER BIPOLAR AQUA 6.0 (INSTRUMENTS) ×2 IMPLANT
SET HNDPC FAN SPRY TIP SCT (DISPOSABLE) ×1 IMPLANT
SOL PREP POV-IOD 4OZ 10% (MISCELLANEOUS) ×2 IMPLANT
SUT ETHIBOND NAB CT1 #1 30IN (SUTURE) ×4 IMPLANT
SUT MNCRL AB 3-0 PS2 18 (SUTURE) ×2 IMPLANT
SUT MON AB 2-0 CT1 36 (SUTURE) ×6 IMPLANT
SUT STRATAFIX PDO 1 14 VIOLET (SUTURE) ×1
SUT STRATFX PDO 1 14 VIOLET (SUTURE) ×1
SUT VIC AB 2-0 CT1 27 (SUTURE) ×2
SUT VIC AB 2-0 CT1 TAPERPNT 27 (SUTURE) ×1 IMPLANT
SUTURE STRATFX PDO 1 14 VIOLET (SUTURE) ×1 IMPLANT
SYR 50ML LL SCALE MARK (SYRINGE) ×2 IMPLANT
TRAY FOLEY W/METER SILVER 16FR (SET/KITS/TRAYS/PACK) ×2 IMPLANT
WATER STERILE IRR 1500ML POUR (IV SOLUTION) ×2 IMPLANT
YANKAUER SUCT BULB TIP 10FT TU (MISCELLANEOUS) ×2 IMPLANT

## 2016-09-04 NOTE — Interval H&P Note (Signed)
History and Physical Interval Note:  09/04/2016 12:47 PM  Anthony Mckinney  has presented today for surgery, with the diagnosis of DJD Left Hip  The various methods of treatment have been discussed with the patient and family. After consideration of risks, benefits and other options for treatment, the patient has consented to  Procedure(s) with comments: LEFT TOTAL HIP ARTHROPLASTY ANTERIOR APPROACH (Left) - Needs RNFA as a surgical intervention .  The patient's history has been reviewed, patient examined, no change in status, stable for surgery.  I have reviewed the patient's chart and labs.  Questions were answered to the patient's satisfaction.     Ninette Cotta, [name]

## 2016-09-04 NOTE — Transfer of Care (Signed)
Immediate Anesthesia Transfer of Care Note  Patient: Anthony Mckinney  Procedure(s) Performed: Procedure(s) with comments: LEFT TOTAL HIP ARTHROPLASTY ANTERIOR APPROACH (Left) - Needs RNFA  Patient Location: PACU  Anesthesia Type:General  Level of Consciousness: awake and alert   Airway & Oxygen Therapy: Patient Spontanous Breathing and Patient connected to face mask oxygen  Post-op Assessment: Report given to RN and Post -op Vital signs reviewed and stable  Post vital signs: Reviewed and stable  Last Vitals:  Vitals:   09/04/16 1056  BP: (!) 142/98  Pulse: 93  Resp: 18  Temp: 36.6 C    Last Pain:  Vitals:   09/04/16 1120  TempSrc:   PainSc: 2       Patients Stated Pain Goal: 4 ([id number])  Complications: No apparent anesthesia complications

## 2016-09-04 NOTE — Anesthesia Postprocedure Evaluation (Signed)
Anesthesia Post Note  Patient: Anthony Mckinney  Procedure(s) Performed: Procedure(s) (LRB): LEFT TOTAL HIP ARTHROPLASTY ANTERIOR APPROACH (Left)  Patient location during evaluation: PACU Anesthesia Type: General Level of consciousness: awake and alert Pain management: pain level controlled Vital Signs Assessment: post-procedure vital signs reviewed and stable Respiratory status: spontaneous breathing, nonlabored ventilation, respiratory function stable and patient connected to nasal cannula oxygen Cardiovascular status: blood pressure returned to baseline and stable Postop Assessment: no signs of nausea or vomiting Anesthetic complications: no    Last Vitals:  Vitals:   09/04/16 1715 09/04/16 1730  BP: (!) 153/84 (!) 150/82  Pulse: 89 86  Resp: (!) 21 16  Temp: 36.6 C 36.5 C    Last Pain:  Vitals:   09/04/16 1730  TempSrc:   PainSc: [place]

## 2016-09-04 NOTE — Op Note (Signed)
OPERATIVE REPORT  SURGEON: Rod Can, MD   ASSISTANT: Vicki Mallet, PA-C.  PREOPERATIVE DIAGNOSIS: Left hip arthritis.   POSTOPERATIVE DIAGNOSIS: Left hip arthritis.   PROCEDURE: Left total hip arthroplasty, anterior approach.   IMPLANTS: DePuy Tri Lock stem, size 6, hi offset. DePuy Pinnacle Cup, size 56 mm. DePuy Altrx liner, size 36 by 56 mm, neutral. DePuy Biolox ceramic head ball, size 36 + 1.5 mm.  ANESTHESIA:  General  ESTIMATED BLOOD LOSS: 550 mL.  ANTIBIOTICS: 3 g Ancef.  DRAINS: None.  COMPLICATIONS: None.   CONDITION: PACU - hemodynamically stable.Marland Kitchen   BRIEF CLINICAL NOTE: Anthony Mckinney is a 60 y.o. male with a long-standing history of Left hip arthritis. After failing conservative management, the patient was indicated for total hip arthroplasty. The risks, benefits, and alternatives to the procedure were explained, and the patient elected to proceed.  PROCEDURE IN DETAIL: Surgical site was marked by myself. Spinal anesthesia was obtained in the pre-op holding area. Once inside the operative room, a foley catheter was inserted. The patient was then positioned on the Hana table. All bony prominences were well padded. The hip was prepped and draped in the normal sterile surgical fashion. A time-out was called verifying side and site of surgery. The patient received IV antibiotics within 60 minutes of beginning the procedure.  The direct anterior approach to the hip was performed through the Hueter interval. Lateral femoral circumflex vessels were treated with the Auqumantys. The anterior capsule was exposed and an inverted T capsulotomy was made.The femoral neck cut was made to the level of the templated cut. A corkscrew was placed into the head and the head was removed. The femoral head was found to have eburnated bone. The head was passed to the back table and was measured.  Acetabular exposure was achieved, and the pulvinar and labrum were  excised. Sequental reaming of the acetabulum was then performed up to a size 55 mm reamer. A 56 mm cup was then opened and impacted into place at approximately 40 degrees of abduction and 20 degrees of anteversion. The final polyethylene liner was impacted into place and acetabular osteophytes were removed.   I then gained femoral exposure taking care to protect the abductors and greater trochanter. This was performed using standard external rotation, extension, and adduction. The capsule was peeled off the inner aspect of the greater trochanter, taking care to preserve the short external rotators. A cookie cutter was used to enter the femoral canal, and then the femoral canal finder was placed. Sequential broaching was performed up to a size 6. Calcar planer was used on the femoral neck remnant. I placed a hi offset neck and a trial head ball. The hip was reduced. Leg lengths and offset were checked fluoroscopically. The hip was dislocated and trial components were removed. The final implants were placed, and the hip was reduced.  Fluoroscopy was used to confirm component position and leg lengths. At 90 degrees of external rotation and full extension, the hip was stable to an anterior directed force.  The wound was copiously irrigated with a dilute betadine solution followed by normal saline. Marcaine solution was injected into the periarticular soft tissue. The wound was closed in layers using #1 Vicryl and Stratafix for the fascia, 2-0 Vicryl for the subcutaneous fat, 2-0 Monocryl for the deep dermal layer, 3-0 running Monocryl subcuticular stitch, and Dermabond for the skin. Once the glue was fully dried, an Aquacell Ag dressing was applied. The patient was transported to the recovery  room in stable condition. Sponge, needle, and instrument counts were correct at the end of the case x2. The patient tolerated the procedure well and there were no known complications.

## 2016-09-04 NOTE — Progress Notes (Signed)
PHARMACIST - PHYSICIAN ORDER COMMUNICATION  CONCERNING: P&T Medication Policy on Herbal Medications  DESCRIPTION:  This patient's order for: Melatonin has been noted.  This product(s) is classified as an "herbal" or natural product. Due to a lack of definitive safety studies or FDA approval, nonstandard manufacturing practices, plus the potential risk of unknown drug-drug interactions while on inpatient medications, the Pharmacy and Therapeutics Committee does not permit the use of "herbal" or natural products of this type within [facility].   ACTION TAKEN: The pharmacy department is unable to verify this order at this time and your patient has been informed of this safety policy. Please reevaluate patient's clinical condition at discharge and address if the herbal or natural product(s) should be resumed at that time.   Lindell Spar, PharmD, BCPS Pager: [phone] 09/04/2016 5:44 PM

## 2016-09-04 NOTE — Discharge Instructions (Signed)
°Dr. Patt Steinhardt °Joint Replacement Specialist °[facility] °[address] °[place], [place] [address] °[phone] ° ° °TOTAL HIP REPLACEMENT POSTOPERATIVE DIRECTIONS ° ° ° °Hip Rehabilitation, Guidelines Following Surgery  ° °WEIGHT BEARING °Weight bearing as tolerated with assist device (walker, cane, etc) as directed, use it as long as suggested by your surgeon or therapist, typically at least 4-6 weeks. ° °The results of a hip operation are greatly improved after range of motion and muscle strengthening exercises. Follow all safety measures which are given to protect your hip. If any of these exercises cause increased pain or swelling in your joint, decrease the amount until you are comfortable again. Then slowly increase the exercises. Call your caregiver if you have problems or questions.  ° °HOME CARE INSTRUCTIONS  °Most of the following instructions are designed to prevent the dislocation of your new hip.  °Remove items at home which could result in a fall. This includes throw rugs or furniture in walking pathways.  °Continue medications as instructed at time of discharge. °· You may have some home medications which will be placed on hold until you complete the course of blood thinner medication. °· You may start showering once you are discharged home. Do not remove your dressing. °Do not put on socks or shoes without following the instructions of your caregivers.   °Sit on chairs with arms. Use the chair arms to help push yourself up when arising.  °Arrange for the use of a toilet seat elevator so you are not sitting low.  °· Walk with walker as instructed.  °You may resume a sexual relationship in one month or when given the OK by your caregiver.  °Use walker as long as suggested by your caregivers.  °You may put full weight on your legs and walk as much as is comfortable. °Avoid periods of inactivity such as sitting longer than an hour when not asleep. This helps prevent  blood clots.  °You may return to work once you are cleared by your surgeon.  °Do not drive a car for 6 weeks or until released by your surgeon.  °Do not drive while taking narcotics.  °Wear elastic stockings for two weeks following surgery during the day but you may remove then at night.  °Make sure you keep all of your appointments after your operation with all of your doctors and caregivers. You should call the office at the above phone number and make an appointment for approximately two weeks after the date of your surgery. °Please pick up a stool softener and laxative for home use as long as you are requiring pain medications. °· ICE to the affected hip every three hours for 30 minutes at a time and then as needed for pain and swelling. Continue to use ice on the hip for pain and swelling from surgery. You may notice swelling that will progress down to the foot and ankle.  This is normal after surgery.  Elevate the leg when you are not up walking on it.   °It is important for you to complete the blood thinner medication as prescribed by your doctor. °· Continue to use the breathing machine which will help keep your temperature down.  It is common for your temperature to cycle up and down following surgery, especially at night when you are not up moving around and exerting yourself.  The breathing machine keeps your lungs expanded and your temperature down. ° °RANGE OF MOTION AND STRENGTHENING EXERCISES  °These exercises are   designed to help you keep full movement of your hip joint. Follow your caregiver's or physical therapist's instructions. Perform all exercises about fifteen times, three times per day or as directed. Exercise both hips, even if you have had only one joint replacement. These exercises can be done on a training (exercise) mat, on the floor, on a table or on a bed. Use whatever works the best and is most comfortable for you. Use music or television while you are exercising so that the exercises  are a pleasant break in your day. This will make your life better with the exercises acting as a break in routine you can look forward to.  °Lying on your back, slowly slide your foot toward your buttocks, raising your knee up off the floor. Then slowly slide your foot back down until your leg is straight again.  °Lying on your back spread your legs as far apart as you can without causing discomfort.  °Lying on your side, raise your upper leg and foot straight up from the floor as far as is comfortable. Slowly lower the leg and repeat.  °Lying on your back, tighten up the muscle in the front of your thigh (quadriceps muscles). You can do this by keeping your leg straight and trying to raise your heel off the floor. This helps strengthen the largest muscle supporting your knee.  °Lying on your back, tighten up the muscles of your buttocks both with the legs straight and with the knee bent at a comfortable angle while keeping your heel on the floor.  ° °SKILLED REHAB INSTRUCTIONS: °If the patient is transferred to a skilled rehab facility following release from the hospital, a list of the current medications will be sent to the facility for the patient to continue.  When discharged from the skilled rehab facility, please have the facility set up the patient's Home Health Physical Therapy prior to being released. Also, the skilled facility will be responsible for providing the patient with their medications at time of release from the facility to include their pain medication and their blood thinner medication. If the patient is still at the rehab facility at time of the two week follow up appointment, the skilled rehab facility will also need to assist the patient in arranging follow up appointment in our office and any transportation needs. ° °MAKE SURE YOU:  °Understand these instructions.  °Will watch your condition.  °Will get help right away if you are not doing well or get worse. ° °Pick up stool softner and  laxative for home use following surgery while on pain medications. °Do not remove your dressing. °The dressing is waterproof--it is OK to take showers. °Continue to use ice for pain and swelling after surgery. °Do not use any lotions or creams on the incision until instructed by your surgeon. °Total Hip Protocol. ° ° °

## 2016-09-04 NOTE — Anesthesia Preprocedure Evaluation (Addendum)
Anesthesia Evaluation  Patient identified by MRN, date of birth, ID band Patient awake    Reviewed: Allergy & Precautions, NPO status , Patient's Chart, lab work & pertinent test results  History of Anesthesia Complications (+) PONV and history of anesthetic complications  Airway Mallampati: I  TM Distance: >3 FB Neck ROM: Full    Dental  (+) Teeth Intact, Dental Advisory Given   Pulmonary neg pulmonary ROS,    breath sounds clear to auscultation       Cardiovascular hypertension, Pt. on medications  Rhythm:Regular Rate:Normal     Neuro/Psych negative neurological ROS  negative psych ROS   GI/Hepatic negative GI ROS, Neg liver ROS,   Endo/Other  negative endocrine ROS  Renal/GU negative Renal ROS  negative genitourinary   Musculoskeletal negative musculoskeletal ROS (+)   Abdominal   Peds negative pediatric ROS (+)  Hematology negative hematology ROS (+)   Anesthesia Other Findings   Reproductive/Obstetrics negative OB ROS                            Lab Results  Component Value Date   WBC 7.0 08/25/2016   HGB 15.3 08/25/2016   HCT 45.1 08/25/2016   MCV 91.7 08/25/2016   PLT 284 08/25/2016   Lab Results  Component Value Date   INR 1.6 (A) 04/15/2016   INR 0.95 10/31/2015   INR 1.04 12/25/2014   PROTIME 24.0 (H) 12/25/2009   PROTIME 36.0 (H) 12/10/2007   PROTIME 36.0 (H) 03/24/2007   07/2016 Negative Stress Test  03/2016 EKG: normal sinus rhythm.  Anesthesia Physical Anesthesia Plan  ASA: II  Anesthesia Plan: General   Post-op Pain Management:    Induction: Intravenous  Airway Management Planned: LMA  Additional Equipment:   Intra-op Plan:   Post-operative Plan: Extubation in OR  Informed Consent: I have reviewed the patients History and Physical, chart, labs and discussed the procedure including the risks, benefits and alternatives for the proposed anesthesia  with the patient or authorized representative who has indicated his/her understanding and acceptance.   Dental advisory given  Plan Discussed with: CRNA  Anesthesia Plan Comments: (INR 1.0, > 24 hrs since Therapeutic Lvnx  Will proceed with GA instead of spinal at patient request)       Anesthesia Quick Evaluation

## 2016-09-04 NOTE — H&P (View-Only) (Signed)
TOTAL HIP ADMISSION H&P  Patient is admitted for left total hip arthroplasty.  Subjective:  Chief Complaint: left hip pain  HPI: Anthony Mckinney, 60 y.o. male, has a history of pain and functional disability in the left hip(s) due to arthritis and patient has failed non-surgical conservative treatments for greater than 12 weeks to include NSAID's and/or analgesics, flexibility and strengthening excercises, use of assistive devices, weight reduction as appropriate and activity modification.  Onset of symptoms was gradual starting 2 years ago with rapidlly worsening course since that time.The patient noted no past surgery on the left hip(s).  Patient currently rates pain in the left hip at 10 out of 10 with activity. Patient has night pain, worsening of pain with activity and weight bearing, pain that interfers with activities of daily living, pain with passive range of motion and crepitus. Patient has evidence of subchondral cysts, subchondral sclerosis, periarticular osteophytes and joint space narrowing by imaging studies. This condition presents safety issues increasing the risk of falls. There is no current active infection.  Patient Active Problem List   Diagnosis Date Noted  . Factor V Leiden ([place])   . Prostate cancer ([place]) 12/25/2014  . Essential hypertension, benign 01/05/2014  . Obesity 01/05/2014   Past Medical History:  Diagnosis Date  . Cancer [facility])    prostate cancer  . DVT (deep venous thrombosis) ([place]) 10/2006  . Factor V Leiden ([place])    on chronic treatment  . High frequency hearing loss    more per right ear / wears hearing aides  . Hypertension   . Nocturia   . Pilonidal cyst   . Pneumonia    age 59/viral  . PONV (postoperative nausea and vomiting)    n/v years ago with ether  . Tinnitus    more per right ear  . Urinary tract infection    3 to 4 years ago     Past Surgical History:  Procedure Laterality Date  . colonscopy     x 3  . CYSTOSCOPY WITH STENT  PLACEMENT Left 12/25/2014   Procedure: CYSTOSCOPY WITH STENT PLACEMENT;  Surgeon: Raynelle Bring, MD;  Location: [facility];  Service: Urology;  Laterality: Left;  . EYE SURGERY     lasik bilat   . INGUINAL HERNIA REPAIR N/A 10/31/2015   Procedure: LAPAROSCOPIC BILATERAL INGUINAL HERNIA , LEFT FEMEROL REPAIR WITH MESH AND PRIMARY UMBILICAL HERNIA REPAIR;  Surgeon: Michael Boston, MD;  Location: [facility];  Service: General;  Laterality: N/A;  . LYMPHADENECTOMY Bilateral 12/25/2014   Procedure: Noel Journey;  Surgeon: Raynelle Bring, MD;  Location: [facility];  Service: Urology;  Laterality: Bilateral;  . pilonidal cyst removal  1970's  . ROBOT ASSISTED LAPAROSCOPIC RADICAL PROSTATECTOMY N/A 12/25/2014   Procedure: ROBOTIC ASSISTED LAPAROSCOPIC RADICAL PROSTATECTOMY LEVEL 2;  Surgeon: Raynelle Bring, MD;  Location: [facility];  Service: Urology;  Laterality: N/A;  . ROBOTIC ASSISTED LAPAROSCOPIC BLADDER DIVERTICULECTOMY N/A 12/25/2014   Procedure: ROBOTIC ASSISTED LAPAROSCOPIC BLADDER DIVERTICULECTOMY;  Surgeon: Raynelle Bring, MD;  Location: [facility];  Service: Urology;  Laterality: N/A;  . TONSILLECTOMY  as child  . UMBILICAL HERNIA REPAIR  10/31/2015   Procedure: HERNIA REPAIR UMBILICAL ADULT;  Surgeon: Michael Boston, MD;  Location: [facility];  Service: General;;  . wisdom ttooth extraction  1978     (Not in a hospital admission) No Known Allergies  Social History  Substance Use Topics  . Smoking status: Never Smoker  . Smokeless tobacco: Never Used  . Alcohol use No  Family History  Problem Relation Age of Onset  . Pulmonary embolism Father      Review of Systems  Constitutional: Negative.   HENT: Negative.   Eyes: Negative.   Respiratory: Negative.   Cardiovascular: Negative.   Gastrointestinal: Negative.   Genitourinary: Negative.   Musculoskeletal: Positive for joint pain.  Skin: Negative.   Neurological: Negative.   Endo/Heme/Allergies: Negative.   Psychiatric/Behavioral: Negative.      Objective:  Physical Exam  Vital signs in last 24 hours: @VSRANGES @  Labs:   Estimated body mass index is 35.62 kg/m as calculated from the following:   Height as of 04/16/16: 6\' 1"  (1.854 m).   Weight as of 04/16/16: 122.5 kg (270 lb).   Imaging Review Plain radiographs demonstrate severe degenerative joint disease of the left hip(s). The bone quality appears to be satisfactory for age and reported activity level.  Assessment/Plan:  End stage arthritis, left hip(s)  The patient history, physical examination, clinical judgement of the provider and imaging studies are consistent with end stage degenerative joint disease of the left hip(s) and total hip arthroplasty is deemed medically necessary. The treatment options including medical management, injection therapy, arthroscopy and arthroplasty were discussed at length. The risks and benefits of total hip arthroplasty were presented and reviewed. The risks due to aseptic loosening, infection, stiffness, dislocation/subluxation,  thromboembolic complications and other imponderables were discussed.  The patient acknowledged the explanation, agreed to proceed with the plan and consent was signed. Patient is being admitted for inpatient treatment for surgery, pain control, PT, OT, prophylactic antibiotics, VTE prophylaxis, progressive ambulation and ADL's and discharge planning.The patient is planning to be discharged home with home health services. H/o Factor V Leiden - coumadin with lovenox bridge

## 2016-09-04 NOTE — Anesthesia Procedure Notes (Signed)
Procedure Name: LMA Insertion Date/Time: 09/04/2016 12:58 PM Performed by: West Pugh Pre-anesthesia Checklist: Patient identified, Emergency Drugs available, Suction available, Patient being monitored and Timeout performed Patient Re-evaluated:Patient Re-evaluated prior to inductionOxygen Delivery Method: Circle system utilized Preoxygenation: Pre-oxygenation with 100% oxygen Intubation Type: IV induction Ventilation: Mask ventilation without difficulty LMA: LMA inserted LMA Size: 5.0 Number of attempts: 1 Placement Confirmation: positive ETCO2,  CO2 detector and breath sounds checked- equal and bilateral Tube secured with: Tape Dental Injury: Teeth and Oropharynx as per pre-operative assessment

## 2016-09-05 ENCOUNTER — Encounter (HOSPITAL_COMMUNITY): Payer: Self-pay | Admitting: Orthopedic Surgery

## 2016-09-05 LAB — BASIC METABOLIC PANEL
Anion gap: 5 (ref 5–15)
BUN: 18 mg/dL (ref 6–20)
CHLORIDE: 101 mmol/L (ref 101–111)
CO2: 27 mmol/L (ref 22–32)
Calcium: 8.1 mg/dL — ABNORMAL LOW (ref 8.9–10.3)
Creatinine, Ser: 1.05 mg/dL (ref 0.61–1.24)
GFR calc Af Amer: >60 mL/min (ref >60)
GFR calc non Af Amer: >60 mL/min (ref >60)
GLUCOSE: 160 mg/dL — AB (ref 65–99)
POTASSIUM: 4.1 mmol/L (ref 3.5–5.1)
Sodium: 133 mmol/L — ABNORMAL LOW (ref 135–145)

## 2016-09-05 LAB — CBC
HEMATOCRIT: 34.7 % — AB (ref 39.0–52.0)
Hemoglobin: 11.8 g/dL — ABNORMAL LOW (ref 13.0–17.0)
MCH: 31.1 pg (ref 26.0–34.0)
MCHC: 34 g/dL (ref 30.0–36.0)
MCV: 91.6 fL (ref 78.0–100.0)
Platelets: 205 10*3/uL (ref 150–400)
RBC: 3.79 MIL/uL — ABNORMAL LOW (ref 4.22–5.81)
RDW: 13.6 % (ref 11.5–15.5)
WBC: 10.5 10*3/uL (ref 4.0–10.5)

## 2016-09-05 MED ORDER — HYDROCODONE-ACETAMINOPHEN 5-325 MG PO TABS: 1.0000 | ORAL_TABLET | ORAL | 0 refills | Status: DC | PRN

## 2016-09-05 MED ORDER — ONDANSETRON HCL 4 MG PO TABS: 4.0000 mg | ORAL_TABLET | Freq: Four times a day (QID) | ORAL | 0 refills | Status: DC | PRN

## 2016-09-05 MED ORDER — ENOXAPARIN SODIUM 40 MG/0.4ML ~~LOC~~ SOLN: 40.0000 mg | SUBCUTANEOUS | 0 refills | Status: DC

## 2016-09-05 MED ORDER — WARFARIN SODIUM 5 MG PO TABS
10.0000 mg | ORAL_TABLET | Freq: Every day | ORAL | Status: DC
  Administered 2016-09-05 – 2016-09-06 (×2): 10 mg via ORAL
  Filled 2016-09-05 (×2): qty 2

## 2016-09-05 MED ORDER — DOCUSATE SODIUM 100 MG PO CAPS: 100.0000 mg | ORAL_CAPSULE | Freq: Two times a day (BID) | ORAL | 3 refills | Status: DC

## 2016-09-05 MED ORDER — WARFARIN - PHYSICIAN DOSING INPATIENT: Freq: Every day | Status: DC

## 2016-09-05 MED ORDER — METHOCARBAMOL 500 MG PO TABS: 500.0000 mg | ORAL_TABLET | Freq: Four times a day (QID) | ORAL | 0 refills | Status: DC | PRN

## 2016-09-05 MED ORDER — SENNA 8.6 MG PO TABS
2.0000 | ORAL_TABLET | Freq: Every day | ORAL | 3 refills | Status: DC
Start: 2016-09-05 — End: 2019-04-02

## 2016-09-05 NOTE — Discharge Summary (Signed)
Physician Discharge Summary  Patient ID: Anthony Mckinney MRN: [id number] DOB/AGE: [birth year] 62 y.o.  Admit date: 09/04/2016 Discharge date: 09/05/2016  Admission Diagnoses:  Primary osteoarthritis of left hip  Discharge Diagnoses:  Principal Problem:   Primary osteoarthritis of left hip   Past Medical History:  Diagnosis Date  . Cancer [facility])    prostate cancer  . DVT (deep venous thrombosis) ([place]) 10/2006  . 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 High frequency hearing loss    more per right ear / wears hearing aides  . Hypertension   . Nocturia   . Pilonidal cyst   . Pneumonia    age 79/viral  . PONV (postoperative nausea and vomiting)    n/v years ago with ether  . Tinnitus    more per right ear  . Urinary tract infection    3 to 4 years ago     Surgeries: Procedure(s): LEFT TOTAL HIP ARTHROPLASTY ANTERIOR APPROACH on 09/04/2016   Consultants (if any):   Discharged Condition: Improved  Hospital Course: Anthony Mckinney is an 60 y.o. male who was admitted 09/04/2016 with a diagnosis of Primary osteoarthritis of left hip and went to the operating room on 09/04/2016 and underwent the above named procedures.    He was given perioperative antibiotics:  Anti-infectives    Start     Dose/Rate Route Frequency Ordered Stop   09/04/16 1900  ceFAZolin (ANCEF) IVPB 2g/100 mL premix     2 g 200 mL/hr over 30 Minutes Intravenous Every 6 hours 09/04/16 1741 09/05/16 0156   09/04/16 1100  ceFAZolin (ANCEF) 3 g in dextrose 5 % 50 mL IVPB    Comments:  Dose increased to 3g per P&T policy for weight > [id number].   3 g 130 mL/hr over 30 Minutes Intravenous On call to O.R. 09/04/16 1056 09/04/16 1315    .  He was given sequential compression devices, early ambulation, and coumadin with lovenox bridge for DVT prophylaxis.  He benefited maximally from the hospital stay and there were no complications.    Recent vital signs:  Vitals:   09/05/16 0218 09/05/16 0553  BP:  134/82 128/63  Pulse: 87 74  Resp: 16 16  Temp: 97.6 F (36.4 C) 98.4 F (36.9 C)    Recent laboratory studies:  Lab Results  Component Value Date   HGB 11.8 (L) 09/05/2016   HGB 13.3 09/04/2016   HGB 15.3 08/25/2016   Lab Results  Component Value Date   WBC 10.5 09/05/2016   PLT 205 09/05/2016   Lab Results  Component Value Date   INR 1.00 09/04/2016   Lab Results  Component Value Date   NA 133 (L) 09/05/2016   K 4.1 09/05/2016   CL 101 09/05/2016   CO2 27 09/05/2016   BUN 18 09/05/2016   CREATININE 1.05 09/05/2016   GLUCOSE 160 (H) 09/05/2016    Discharge Medications:     Medication List    STOP taking these medications   acetaminophen 500 MG tablet Commonly known as:  TYLENOL     TAKE these medications   celecoxib 200 MG capsule Commonly known as:  CELEBREX Take 200 mg by mouth daily.   docusate sodium 100 MG capsule Commonly known as:  COLACE Take 1 capsule (100 mg total) by mouth 2 (two) times daily.   enoxaparin 40 MG/0.4ML injection Commonly known as:  LOVENOX Inject 0.4 mLs (40 mg total) into the skin daily. What changed:  medication  strength  how much to take  additional instructions   HYDROcodone-acetaminophen 5-325 MG tablet Commonly known as:  NORCO/VICODIN Take 1-2 tablets by mouth every 4 (four) hours as needed (breakthrough pain).   losartan-hydrochlorothiazide 50-12.5 MG tablet Commonly known as:  HYZAAR Take 1 tablet by mouth daily.   Melatonin 10 MG Tabs Take 20-30 mg by mouth at bedtime as needed (sleep).   methocarbamol 500 MG tablet Commonly known as:  ROBAXIN Take 1 tablet (500 mg total) by mouth every 6 (six) hours as needed for muscle spasms.   multivitamin with minerals tablet Take 1 tablet by mouth daily.   ondansetron 4 MG tablet Commonly known as:  ZOFRAN Take 1 tablet (4 mg total) by mouth every 6 (six) hours as needed for nausea.   phentermine 37.5 MG tablet Commonly known as:  ADIPEX-P Take 18.75 mg  by mouth daily before breakfast.   senna 8.6 MG Tabs tablet Commonly known as:  SENOKOT Take 2 tablets (17.2 mg total) by mouth at bedtime.   traZODone 50 MG tablet Commonly known as:  DESYREL Take 50 mg by mouth at bedtime as needed for sleep.   warfarin 10 MG tablet Commonly known as:  COUMADIN Take 1 tablet (10 mg total) by mouth every Tuesday, Thursday, Saturday, and Sunday at 6 PM. What changed:  when to take this      Take lovenox 40mg  daily for 2 days, then resume 100 mg daily until INR > 2.4. Follow up with PCP on Monday for INR check.   Diagnostic Studies: Dg Pelvis Portable  Result Date: 09/04/2016 CLINICAL DATA:  Status post total hip replacement on the left EXAM: PORTABLE PELVIS 1-2 VIEWS COMPARISON:  January 05, 2015 FINDINGS: Frontal view of lower pelvis and hips obtained. Patient is status post total hip replacement on the left with prosthetic components appearing well-seated. No acute fracture or dislocation. Right hip joint appears unremarkable. IMPRESSION: Status post total hip replacement on the left with prosthetic components well-seated. No acute fracture or dislocation in visualized regions. Electronically Signed   By: Lowella Grip III M.D.   On: 09/04/2016 16:51   Dg C-arm 61-120 Min-no Report  Result Date: 09/04/2016 CLINICAL DATA: hip C-ARM 61-120 MINUTES Fluoroscopy was utilized by the requesting physician.  No radiographic interpretation.   Dg Hip Operative Unilat With Pelvis Left  Result Date: 09/04/2016 CLINICAL DATA:  Left total hip replacement. Fluoroscopy time 0.4 minutes EXAM: OPERATIVE LEFT HIP (WITH PELVIS IF PERFORMED) 2 VIEWS TECHNIQUE: Fluoroscopic spot image(s) were submitted for interpretation post-operatively. COMPARISON:  None. FINDINGS: Frontal views are performed, demonstrating total hip arthroplasty. Femoral component appears well seated in the acetabular component on the frontal views provided. No interval fractures identified.  IMPRESSION: Status post total hip arthroplasty.  No adverse features identified. Electronically Signed   By: Nolon Nations M.D.   On: 09/04/2016 15:32    Disposition: 01-Home or Self Care  Discharge Instructions    Call MD / Call 911    Complete by:  As directed    If you experience chest pain or shortness of breath, CALL 911 and be transported to the hospital emergency room.  If you develope a fever above 101 F, pus (white drainage) or increased drainage or redness at the wound, or calf pain, call your surgeon's office.   Constipation Prevention    Complete by:  As directed    Drink plenty of fluids.  Prune juice may be helpful.  You may use a stool softener, such  as Colace (over the counter) 100 mg twice a day.  Use MiraLax (over the counter) for constipation as needed.   Diet - low sodium heart healthy    Complete by:  As directed    Discharge instructions    Complete by:  As directed    Continue coumadin 10 mg daily. For the next 2 days, you will take 40 mg lovenox injection daily. Then resume 100 mg daily until your INR > 2.4. Follow up with your primary care doctor this Monday (10/9) for INR check   Driving restrictions    Complete by:  As directed    No driving for 6 weeks   Increase activity slowly as tolerated    Complete by:  As directed    Lifting restrictions    Complete by:  As directed    No lifting for 6 weeks   TED hose    Complete by:  As directed    Use stockings (TED hose) for 2 weeks on both leg(s).  You may remove them at night for sleeping.      Follow-up Information    Shihab States, [name], MD. Schedule an appointment as soon as possible for a visit in 2 weeks.   Specialty:  Orthopedic Surgery Why:  For wound re-check Contact information: [address] [address] [phone]            Signed: Elie Goody 09/05/2016, 8:13 AM

## 2016-09-05 NOTE — Progress Notes (Signed)
Physical Therapy Treatment Patient Details Name: Anthony Mckinney MRN: [id number] DOB: 11/30/1956 Today's Date: 09/05/2016    History of Present Illness L DATHA    PT Comments    The patient is progressing well. He reports that he does not desire HHPT.  Continue PT while in acute care.  Follow Up Recommendations  Home health PT;Supervision/Assistance - 24 hour     Equipment Recommendations  None recommended by PT    Recommendations for Other Services       Precautions / Restrictions Precautions Precautions: Fall Restrictions Weight Bearing Restrictions: No    Mobility  Bed Mobility Overal bed mobility: Modified Independent             General bed mobility comments: oob  Transfers Overall transfer level: Needs assistance Equipment used: Rolling walker (2 wheeled) Transfers: Sit to/from Stand Sit to Stand: Supervision         General transfer comment: for safety  Ambulation/Gait Ambulation/Gait assistance: Supervision Ambulation Distance (Feet): 300 Feet Assistive device: Rolling walker (2 wheeled) Gait Pattern/deviations: Step-through pattern     General Gait Details: cues for sequence, gait is smoothe   Stairs Stairs: Yes   Stair Management: No rails;Forwards Number of Stairs: 2 General stair comments: simulated using cane and  counter beside the steps., cues for sequence  Wheelchair Mobility    Modified Rankin (Stroke Patients Only)       Balance                                    Cognition Arousal/Alertness: Awake/alert Behavior During Therapy: Impulsive Overall Cognitive Status: Within Functional Limits for tasks assessed                      Exercises      General Comments        Pertinent Vitals/Pain Pain Score: 2  Pain Location: L incision Pain Descriptors / Indicators: Sore Pain Intervention(s): Monitored during session;Premedicated before session;Ice applied    Home Living Family/patient  expects to be discharged to:: Private residence Living Arrangements: Spouse/significant other Available Help at Discharge: Family         Home Equipment: Toilet riser;Walker - 2 wheels;Cane - single point      Prior Function Level of Independence: Independent          PT Goals (current goals can now be found in the care plan section) Acute Rehab PT Goals Patient Stated Goal: to walk without  pain Progress towards PT goals: Progressing toward goals    Frequency    7X/week      PT Plan Current plan remains appropriate    Co-evaluation             End of Session   Activity Tolerance: Patient tolerated treatment well Patient left: in bed;with call bell/phone within reach;with bed alarm set;with family/visitor present     Time: 1430-1455 PT Time Calculation (min) (ACUTE ONLY): 25 min  Charges:  $Gait Training: 23-37 mins                    G Codes:      Claretha Cooper 09/05/2016, 4:11 PM

## 2016-09-05 NOTE — Evaluation (Signed)
Occupational Therapy Evaluation Patient Details Name: FREDERICH STELLHORN MRN: [id number] DOB: Feb 16, 1956 Today's Date: 09/05/2016    History of Present Illness L DATHA   Clinical Impression   This 60 year old man was admitted for the above sx. All education was completed. No further OT is needed at this time    Follow Up Recommendations  No OT follow up    Equipment Recommendations  None recommended by OT    Recommendations for Other Services       Precautions / Restrictions Precautions Precautions: Fall Restrictions Weight Bearing Restrictions: No      Mobility Bed Mobility Overal bed mobility: Needs Assistance Bed Mobility: Supine to Sit     Supine to sit: Supervision     General bed mobility comments: oob  Transfers Overall transfer level: Needs assistance Equipment used: Rolling walker (2 wheeled) Transfers: Sit to/from Stand Sit to Stand: Min guard         General transfer comment: for safety    Balance                                            ADL Overall ADL's : Needs assistance/impaired             Lower Body Bathing: Minimal assistance;Sit to/from stand       Lower Body Dressing: Minimal assistance;Sit to/from stand   Toilet Transfer: Min guard;Ambulation;RW (back to chair:  pt has a high commode at home)   Toileting- Water quality scientist and Hygiene: Supervision/safety (stood at toilet to urinate)         General ADL Comments: wife will assist with adls as needed.  Educated on shower transfer, but pt did not practice this session.  New high commode installed and he has a toilet riser.      Vision     Perception     Praxis      Pertinent Vitals/Pain Pain Assessment: 0-10 Pain Score: 1  Pain Location: L incision Pain Descriptors / Indicators: Sore Pain Intervention(s): Limited activity within patient's tolerance;Monitored during session;Premedicated before session;Repositioned     Hand Dominance      Extremity/Trunk Assessment Upper Extremity Assessment Upper Extremity Assessment: Overall WFL for tasks assessed      Cervical / Trunk Assessment Cervical / Trunk Assessment: Normal   Communication Communication Communication: No difficulties   Cognition Arousal/Alertness: Awake/alert Behavior During Therapy: WFL for tasks assessed/performed Overall Cognitive Status: Within Functional Limits for tasks assessed                     General Comments       Exercises       Shoulder Instructions      Home Living Family/patient expects to be discharged to:: Private residence Living Arrangements: Spouse/significant other Available Help at Discharge: Family Type of Home: House Home Access: Stairs to enter Technical brewer of Steps: 2  withcounter top beside   Home Layout: Two level;Able to live on main level with bedroom/bathroom     Bathroom Shower/Tub: Hospital doctor Toilet: Handicapped height     Home Equipment: Toilet riser;Walker - 2 wheels;Cane - single point          Prior Functioning/Environment Level of Independence: Independent                 OT Problem List:  OT Treatment/Interventions:      OT Goals(Current goals can be found in the care plan section) Acute Rehab OT Goals Patient Stated Goal: to walk without  pain OT Goal Formulation: All assessment and education complete, DC therapy  OT Frequency:     Barriers to D/C:            Co-evaluation              End of Session    Activity Tolerance: Patient tolerated treatment well Patient left: in chair;with call bell/phone within reach;with family/visitor present   Time: [id number] OT Time Calculation (min): 20 min Charges:  OT General Charges $OT Visit: 1 Procedure OT Evaluation $OT Eval Low Complexity: 1 Procedure G-Codes:    Riti Rollyson 2016-09-24, 2:16 PM  Lesle Chris, OTR/L [phone] 24-Sep-2016

## 2016-09-05 NOTE — Care Management Note (Signed)
Case Management Note  Patient Details  Name: Anthony Mckinney MRN: [id number] Date of Birth: 1956/07/06  Subjective/Objective:   Patient has all DME. [facility] for HHPT-rep aware.                 Action/Plan:d/c home w/HHC.   Expected Discharge Date:                  Expected Discharge Plan:  [place]  In-House Referral:     Discharge planning Services  CM Consult  Post Acute Care Choice:    Choice offered to:  Patient  DME Arranged:    DME Agency:     HH Arranged:  PT [place]:  [facility] (now [facility])  Status of Service:  Completed, signed off  If discussed at [place] of Stay Meetings, dates discussed:    Additional Comments:  Dessa Phi, RN 09/05/2016, 1:03 PM

## 2016-09-05 NOTE — Care Management Note (Signed)
Case Management Note  Patient Details  Name: Anthony Mckinney MRN: [id number] Date of Birth: [birth year]  Subjective/Objective: L Knee arthritis. POD#1 L THA. From home. [facility] rep Octavia Bruckner already following for d/c-HHPT. No dme recc.No further CM needs.                   Action/Plan:d/c home w/HHC.   Expected Discharge Date:                  Expected Discharge Plan:  [place]  In-House Referral:     Discharge planning Services  CM Consult  Post Acute Care Choice:    Choice offered to:  Patient  DME Arranged:    DME Agency:     HH Arranged:  PT [place]:  [facility] ([facility])  Status of Service:  Completed, signed off  If discussed at [place] of Stay Meetings, dates discussed:    Additional Comments:  Dessa Phi, RN 09/05/2016, 12:37 PM

## 2016-09-05 NOTE — Evaluation (Signed)
Physical Therapy Evaluation Patient Details Name: Anthony Mckinney MRN: [id number] DOB: [birth year] Today's Date: 09/05/2016   History of Present Illness  L DATHA  Clinical Impression  The patiwent is ambulating well, mobilizing well with minimal complaints of pain. Pt admitted with above diagnosis. Pt currently with functional limitations due to the deficits listed below (see PT Problem List).  Pt will benefit from skilled PT to increase their independence and safety with mobility to allow discharge to the venue listed below.       Follow Up Recommendations Home health PT;Supervision/Assistance - 24 hour    Equipment Recommendations  None recommended by PT    Recommendations for Other Services       Precautions / Restrictions Precautions Precautions: Fall      Mobility  Bed Mobility Overal bed mobility: Needs Assistance Bed Mobility: Supine to Sit     Supine to sit: Supervision     General bed mobility comments: cues for technique, patient moved left leg without assistance, really slid across the bed  Transfers Overall transfer level: Needs assistance Equipment used: Rolling walker (2 wheeled) Transfers: Sit to/from Stand Sit to Stand: Min guard         General transfer comment: cueas for hand and LLE position  Ambulation/Gait Ambulation/Gait assistance: Min guard Ambulation Distance (Feet): 300 Feet Assistive device: Rolling walker (2 wheeled) Gait Pattern/deviations: Step-to pattern;Step-through pattern     General Gait Details: cues for sequence, gait is smoothe  Science writer    Modified Rankin (Stroke Patients Only)       Balance                                             Pertinent Vitals/Pain Pain Assessment: 0-10 Pain Score: 2  Pain Location: L hip/thigh Pain Descriptors / Indicators: Sore Pain Intervention(s): Repositioned;Ice applied;Premedicated before session;Monitored during session     [place] expects to be discharged to:: Private residence Living Arrangements: Spouse/significant other Available Help at Discharge: Family Type of Home: House Home Access: Stairs to enter   [employer] of Steps: 2  withcounter top beside [place]: Two level;Able to live on main level with bedroom/bathroom Home Equipment: Gilford Rile - 2 wheels;Cane - single point;Bedside commode      Prior Function                 Hand Dominance        Extremity/Trunk Assessment   Upper Extremity Assessment: Defer to OT evaluation           Lower Extremity Assessment: LLE deficits/detail   LLE Deficits / Details: actively flexes the leg in supine  Cervical / Trunk Assessment: Normal  Communication      Cognition Arousal/Alertness: Awake/alert Behavior During Therapy: WFL for tasks assessed/performed Overall Cognitive Status: Within Functional Limits for tasks assessed                      General Comments      Exercises Total Joint Exercises Ankle Circles/Pumps: AROM;Both;10 reps Quad Sets: AROM;Both;10 reps Short Arc Quad: AROM;Left;10 reps Heel Slides: AROM;Left;10 reps Hip ABduction/ADduction: AROM;Left;10 reps   Assessment/Plan    PT Assessment Patient needs continued PT services  PT Problem List Decreased strength;Decreased range of motion;Decreased activity tolerance;Decreased mobility;Decreased knowledge of precautions;Decreased safety awareness;Decreased  knowledge of use of DME;Pain          PT Treatment Interventions DME instruction;Gait training;Stair training;Functional mobility training;Therapeutic activities;Patient/family education;Therapeutic exercise    PT Goals (Current goals can be found in the Care Plan section)  Acute Rehab PT Goals Patient Stated Goal: to walk without  pain PT Goal Formulation: With patient/family Time For Goal Achievement: 09/19/16 Potential to Achieve Goals: Good    Frequency  7X/week   Barriers to discharge        Co-evaluation               End of Session   Activity Tolerance: Patient tolerated treatment well Patient left: in chair;with call bell/phone within reach;with family/visitor present Nurse Communication: Mobility status         Time: [id number] PT Time Calculation (min) (ACUTE ONLY): 40 min   Charges:   PT Evaluation $PT Eval Low Complexity: 1 Procedure PT Treatments $Gait Training: 8-22 mins $Therapeutic Exercise: 8-22 mins   PT G Codes:        Claretha Cooper 09/05/2016, 12:06 PM Tresa Endo PT [phone]

## 2016-09-05 NOTE — Progress Notes (Addendum)
   Subjective:  Patient reports pain as mild to moderate.  No c/o.  Objective:   VITALS:   Vitals:   09/04/16 2010 09/04/16 2130 09/05/16 0218 09/05/16 0553  BP: (!) 159/89 (!) 146/91 134/82 128/63  Pulse: 93 91 87 74  Resp: 16 16 16 16   Temp: 98 F (36.7 C) 97.7 F (36.5 C) 97.6 F (36.4 C) 98.4 F (36.9 C)  TempSrc: Oral Oral Oral Oral  SpO2: 100% 100% 100% 97%  Weight:      Height:        NAD ABD soft Sensation intact distally Intact pulses distally Dorsiflexion/Plantar flexion intact Incision: dressing C/D/I Compartment soft    Lab Results  Component Value Date   WBC 10.5 09/05/2016   HGB 11.8 (L) 09/05/2016   HCT 34.7 (L) 09/05/2016   MCV 91.6 09/05/2016   PLT 205 09/05/2016   BMET    Component Value Date/Time   NA 133 (L) 09/05/2016 0502   K 4.1 09/05/2016 0502   CL 101 09/05/2016 0502   CO2 27 09/05/2016 0502   GLUCOSE 160 (H) 09/05/2016 0502   BUN 18 09/05/2016 0502   CREATININE 1.05 09/05/2016 0502   CALCIUM 8.1 (L) 09/05/2016 0502   GFRNONAA >60 09/05/2016 0502   GFRAA >60 09/05/2016 0502     Assessment/Plan: 1 Day Post-Op   Principal Problem:   Primary osteoarthritis of left hip   WBAT with walker PO pain control DVT ppx: coumadin with lovenox bridge, SCDs, TEDs PT/OT Dispo: recheck hgb in am, d/c home with HHPT tomorrow   Anthony Mckinney, Anthony Mckinney 09/05/2016, 8:00 AM   Rod Can, MD Cell [phone]

## 2016-09-06 LAB — CBC
HCT: 33.2 % — ABNORMAL LOW (ref 39.0–52.0)
Hemoglobin: 11.6 g/dL — ABNORMAL LOW (ref 13.0–17.0)
MCH: 31.4 pg (ref 26.0–34.0)
MCHC: 34.9 g/dL (ref 30.0–36.0)
MCV: 89.7 fL (ref 78.0–100.0)
PLATELETS: 220 10*3/uL (ref 150–400)
RBC: 3.7 MIL/uL — ABNORMAL LOW (ref 4.22–5.81)
RDW: 13.3 % (ref 11.5–15.5)
WBC: 11.6 10*3/uL — ABNORMAL HIGH (ref 4.0–10.5)

## 2016-09-06 NOTE — Progress Notes (Signed)
Nursing Discharge Summary  Patient ID: QUNICY SWIGGUM MRN: [id number] DOB/AGE: 06/08/1956 60 y.o.  Admit date: 09/04/2016 Discharge date: 09/06/2016  Discharged Condition: good  Disposition: 01-Home or Self Care  Follow-up Information    [place], [name], MD. Schedule an appointment as soon as possible for a visit in 2 week(s).   Specialty:  Orthopedic Surgery Why:  For wound re-check Contact information: [address] [place] [address] [phone]        [facility] .   Contact information: [address] [place] [address] [phone]        [facility] .   Specialty:  [place] Why:  [facility] physical therapy Contact information: [place] [address] [phone]           Prescriptions Given: Multiple prescriptions given. Prescription for Colace, Senokot, Lovenox, Robaxin and Vicodin.  Follow up appointments, medications, and when to call the doctor discussed with patient and his wife.  Both verbalized understanding without further questions.    Means of Discharge: Patient taken downstairs via wheelchair to be discharged via private vehicle.   Signed: Buel Ream 09/06/2016, 10:21 AM

## 2016-09-06 NOTE — Progress Notes (Signed)
Subjective: 2 Days Post-Op Procedure(s) (LRB): LEFT TOTAL HIP ARTHROPLASTY ANTERIOR APPROACH (Left) Patient reports pain as well controlled.  Tolerating PO's. Progressing with PT. Denies SOB,CP,or Calf pain. Reports a good night and is ready for D/c.  Objective: Vital signs in last 24 hours: Temp:  [98 F (36.7 C)-98.6 F (37 C)] 98.5 F (36.9 C) (10/07 0500) Pulse Rate:  [69-77] 76 (10/07 0500) Resp:  [14-16] 16 (10/07 0500) BP: (135-197)/(68-85) 143/68 (10/07 0500) SpO2:  [97 %-100 %] 97 % (10/07 0500)  Intake/Output from previous day: 10/06 0701 - 10/07 0700 In: 600 [P.O.:600] Out: 300 [Urine:300] Intake/Output this shift: No intake/output data recorded.   Recent Labs  09/04/16 1835 09/05/16 0502 09/06/16 0439  HGB 13.3 11.8* 11.6*    Recent Labs  09/05/16 0502 09/06/16 0439  WBC 10.5 11.6*  RBC 3.79* 3.70*  HCT 34.7* 33.2*  PLT 205 220    Recent Labs  09/04/16 1835 09/05/16 0502  NA  --  133*  K  --  4.1  CL  --  101  CO2  --  27  BUN  --  18  CREATININE 1.08 1.05  GLUCOSE  --  160*  CALCIUM  --  8.1*    Recent Labs  09/04/16 1109  INR 1.00    Alert and oriented x3. RRR, Lungs clear, BS x4. Left Calf soft and non tender. L hip dressing C/D/I. No DVT signs. No signs of infection or compartment syndrome. LLE grossly neurovascularly intact.   Assessment/Plan: 2 Days Post-Op Procedure(s) (LRB): LEFT TOTAL HIP ARTHROPLASTY ANTERIOR APPROACH (Left) Up with PT D/c home F/u in office with Dr.Swinteck Follow instructions  Maudell Stanbrough L 09/06/2016, 8:13 AM

## 2016-09-06 NOTE — Progress Notes (Signed)
Physical Therapy Treatment Patient Details Name: KREW AMADIO MRN: [id number] DOB: Dec 16, 1955 Today's Date: 09/06/2016    History of Present Illness L DATHA    PT Comments    Pt ambulated in hallway and performed safe stair technique. Pt was able to perform all mobility with modified independence or supervision. Pt reports no need for home health PT and feels ready to be discharged home to continue healing.  Follow Up Recommendations  Home health PT;Supervision/Assistance - 24 hour     Equipment Recommendations  None recommended by PT    Recommendations for Other Services       Precautions / Restrictions Precautions Precautions: Fall Restrictions Weight Bearing Restrictions: No Other Position/Activity Restrictions: WBAT    Mobility  Bed Mobility Overal bed mobility: Modified Independent Bed Mobility: Supine to Sit;Sit to Supine     Supine to sit: Modified independent (Device/Increase time) Sit to supine: Modified independent (Device/Increase time)   General bed mobility comments: slightly increased time; pt able to perform bed mobility safely with no verbal cues  Transfers Overall transfer level: Needs assistance Equipment used: Rolling walker (2 wheeled) Transfers: Sit to/from Stand Sit to Stand: Supervision         General transfer comment: supervision for safety and impulsivity  Ambulation/Gait Ambulation/Gait assistance: Supervision Ambulation Distance (Feet): 400 Feet Assistive device: Rolling walker (2 wheeled) Gait Pattern/deviations: Step-through pattern (slightly decreased hip flexion on L)     General Gait Details: supervision for safety and impulsivity; pt initially reported slight tightness in L hip, decreasing with increased distance   Stairs Stairs: Yes Stairs assistance: Supervision Stair Management: One rail Right Number of Stairs: 2 General stair comments: used R stair rail for support and safety; required verbal cues for  sequencing, was able to recall when cued  Wheelchair Mobility    Modified Rankin (Stroke Patients Only)       Balance                                    Cognition Arousal/Alertness: Awake/alert Behavior During Therapy: Impulsive Overall Cognitive Status: Within Functional Limits for tasks assessed                      Exercises      General Comments        Pertinent Vitals/Pain Pain Assessment: 0-10 Pain Score: 4  Pain Location: L hip Pain Descriptors / Indicators: Discomfort;Sore Pain Intervention(s): Limited activity within patient's tolerance;Monitored during session;Repositioned;Premedicated before session;Ice applied    Home Living                      Prior Function            PT Goals (current goals can now be found in the care plan section) Progress towards PT goals: Progressing toward goals    Frequency    7X/week      PT Plan Current plan remains appropriate    Co-evaluation             End of Session Equipment Utilized During Treatment: Gait belt Activity Tolerance: Patient tolerated treatment well Patient left: in bed;with call bell/phone within reach;with bed alarm set     Time: 0852-0906 PT Time Calculation (min) (ACUTE ONLY): 14 min  Charges:  $Gait Training: 8-22 mins  G CodesDewitt Hoes 09/06/2016, 12:04 PM Dewitt Hoes, SPT

## 2016-09-06 NOTE — Discharge Summary (Signed)
Physician Discharge Summary  Patient ID: Anthony Mckinney MRN: [id number] DOB/AGE: 1956/07/25 60 y.o.  Admit date: 09/04/2016 Discharge date: 09/06/2016  Admission Diagnoses:  Discharge Diagnoses:  Principal Problem:   Primary osteoarthritis of left hip   Discharged Condition: good  Hospital Course:  CLABORN KROLCZYK is a 60 y.o. who was admitted to [facility]. They were brought to the operating room on 09/04/2016 and underwent Procedure(s): LEFT TOTAL HIP ARTHROPLASTY ANTERIOR APPROACH.  Patient tolerated the procedure well and was later transferred to the recovery room and then to the orthopaedic floor for postoperative care.  They were given PO and IV analgesics for pain control following their surgery.  They were given 24 hours of postoperative antibiotics of  Anti-infectives    Start     Dose/Rate Route Frequency Ordered Stop   09/04/16 1900  ceFAZolin (ANCEF) IVPB 2g/100 mL premix     2 g 200 mL/hr over 30 Minutes Intravenous Every 6 hours 09/04/16 1741 09/05/16 0156   09/04/16 1100  ceFAZolin (ANCEF) 3 g in dextrose 5 % 50 mL IVPB    Comments:  Dose increased to 3g per P&T policy for weight > [id number].   3 g 130 mL/hr over 30 Minutes Intravenous On call to O.R. 09/04/16 1056 09/04/16 1315     and started on DVT prophylaxis in the form of lovenox to warafin.   PT and OT were ordered for total joint protocol.  Discharge planning consulted to help with postop disposition and equipment needs.  Patient had a good night on the evening of surgery and started to get up OOB with therapy on day one.Continued to work with therapy into day two.  Dressing was with normal limits.  The patient had progressed with therapy and meeting their goals. Patient was seen in rounds and was ready to go home.  Consults: n/a  Significant Diagnostic Studies: routine  Treatments: routine  Discharge Exam: Blood pressure (!) 143/68, pulse 76, temperature 98.5 F (36.9 C), temperature source Oral, resp.  rate 16, height 6\' 1"  (1.854 m), weight 123.8 kg (273 lb), SpO2 97 %. Alert and oriented x3. RRR, Lungs clear, BS x4. Left Calf soft and non tender. L hip dressing C/D/I. No DVT signs. No signs of infection or compartment syndrome. LLE grossly neurovascularly intact.   Disposition: 01-Home or Self Care  Discharge Instructions    Call MD / Call 911    Complete by:  As directed    If you experience chest pain or shortness of breath, CALL 911 and be transported to the hospital emergency room.  If you develope a fever above 101 F, pus (white drainage) or increased drainage or redness at the wound, or calf pain, call your surgeon's office.   Constipation Prevention    Complete by:  As directed    Drink plenty of fluids.  Prune juice may be helpful.  You may use a stool softener, such as Colace (over the counter) 100 mg twice a day.  Use MiraLax (over the counter) for constipation as needed.   Diet - low sodium heart healthy    Complete by:  As directed    Discharge instructions    Complete by:  As directed    Continue coumadin 10 mg daily. For the next 2 days, you will take 40 mg lovenox injection daily. Then resume 100 mg daily until your INR > 2.4. Follow up with your primary care doctor this Monday (10/9) for INR check   Driving restrictions  Complete by:  As directed    No driving for 6 weeks   Increase activity slowly as tolerated    Complete by:  As directed    Lifting restrictions    Complete by:  As directed    No lifting for 6 weeks   TED hose    Complete by:  As directed    Use stockings (TED hose) for 2 weeks on both leg(s).  You may remove them at night for sleeping.       Medication List    STOP taking these medications   acetaminophen 500 MG tablet Commonly known as:  TYLENOL     TAKE these medications   celecoxib 200 MG capsule Commonly known as:  CELEBREX Take 200 mg by mouth daily.   docusate sodium 100 MG capsule Commonly known as:  COLACE Take 1 capsule  (100 mg total) by mouth 2 (two) times daily.   enoxaparin 40 MG/0.4ML injection Commonly known as:  LOVENOX Inject 0.4 mLs (40 mg total) into the skin daily. What changed:  medication strength  how much to take  additional instructions   HYDROcodone-acetaminophen 5-325 MG tablet Commonly known as:  NORCO/VICODIN Take 1-2 tablets by mouth every 4 (four) hours as needed (breakthrough pain).   losartan-hydrochlorothiazide 50-12.5 MG tablet Commonly known as:  HYZAAR Take 1 tablet by mouth daily.   Melatonin 10 MG Tabs Take 20-30 mg by mouth at bedtime as needed (sleep).   methocarbamol 500 MG tablet Commonly known as:  ROBAXIN Take 1 tablet (500 mg total) by mouth every 6 (six) hours as needed for muscle spasms.   multivitamin with minerals tablet Take 1 tablet by mouth daily.   ondansetron 4 MG tablet Commonly known as:  ZOFRAN Take 1 tablet (4 mg total) by mouth every 6 (six) hours as needed for nausea.   phentermine 37.5 MG tablet Commonly known as:  ADIPEX-P Take 18.75 mg by mouth daily before breakfast.   senna 8.6 MG Tabs tablet Commonly known as:  SENOKOT Take 2 tablets (17.2 mg total) by mouth at bedtime.   traZODone 50 MG tablet Commonly known as:  DESYREL Take 50 mg by mouth at bedtime as needed for sleep.   warfarin 10 MG tablet Commonly known as:  COUMADIN Take 1 tablet (10 mg total) by mouth every Tuesday, Thursday, Saturday, and Sunday at 6 PM. What changed:  when to take this      Follow-up Information    Swinteck, [name], MD. Schedule an appointment as soon as possible for a visit in 2 week(s).   Specialty:  Orthopedic Surgery Why:  For wound re-check Contact information: [address] [place] [address] [phone]        [facility] .   Contact information: [address] [place] [address] [phone]        [facility] .   Specialty:  [place] Why:  [facility] physical  therapy Contact information: [address] [place] [address] [phone]           Signed: Lajean Manes 09/06/2016, 9:28 AM

## 2016-09-09 ENCOUNTER — Telehealth: Payer: Self-pay

## 2016-10-09 ENCOUNTER — Telehealth: Payer: Self-pay

## 2016-10-12 NOTE — Progress Notes (Signed)
Unable to reach patient.

## 2019-04-02 ENCOUNTER — Emergency Department (HOSPITAL_COMMUNITY)
Admission: EM | Admit: 2019-04-02 | Discharge: 2019-04-03 | Disposition: A | Discharge status: Home / Self Care | Payer: Self-pay | Attending: Emergency Medicine | Admitting: Emergency Medicine

## 2019-04-02 ENCOUNTER — Encounter (HOSPITAL_COMMUNITY): Payer: Self-pay | Admitting: Pharmacy Technician

## 2019-04-02 ENCOUNTER — Other Ambulatory Visit: Payer: Self-pay

## 2019-04-02 ENCOUNTER — Emergency Department (HOSPITAL_COMMUNITY): Payer: Self-pay

## 2019-04-02 DIAGNOSIS — H539 Unspecified visual disturbance: Secondary | ICD-10-CM | POA: Insufficient documentation

## 2019-04-02 DIAGNOSIS — I10 Essential (primary) hypertension: Secondary | ICD-10-CM | POA: Insufficient documentation

## 2019-04-02 DIAGNOSIS — Z79899 Other long term (current) drug therapy: Secondary | ICD-10-CM | POA: Insufficient documentation

## 2019-04-02 DIAGNOSIS — Z7901 Long term (current) use of anticoagulants: Secondary | ICD-10-CM | POA: Insufficient documentation

## 2019-04-02 DIAGNOSIS — Z86718 Personal history of other venous thrombosis and embolism: Secondary | ICD-10-CM | POA: Insufficient documentation

## 2019-04-02 DIAGNOSIS — D682 Hereditary deficiency of other clotting factors: Secondary | ICD-10-CM | POA: Insufficient documentation

## 2019-04-02 DIAGNOSIS — G459 Transient cerebral ischemic attack, unspecified: Secondary | ICD-10-CM

## 2019-04-02 DIAGNOSIS — Z8546 Personal history of malignant neoplasm of prostate: Secondary | ICD-10-CM | POA: Insufficient documentation

## 2019-04-02 DIAGNOSIS — R4701 Aphasia: Secondary | ICD-10-CM | POA: Insufficient documentation

## 2019-04-02 LAB — LIPID PANEL
Cholesterol: 159 mg/dL (ref 0–200)
HDL: 44 mg/dL (ref >40)
LDL Cholesterol: 102 mg/dL — ABNORMAL HIGH (ref 0–99)
Total CHOL/HDL Ratio: 3.6 RATIO
Triglycerides: 65 mg/dL (ref <150)
VLDL: 13 mg/dL (ref 0–40)

## 2019-04-02 LAB — CBC
HCT: 44.7 % (ref 39.0–52.0)
Hemoglobin: 15.4 g/dL (ref 13.0–17.0)
MCH: 30.9 pg (ref 26.0–34.0)
MCHC: 34.5 g/dL (ref 30.0–36.0)
MCV: 89.8 fL (ref 80.0–100.0)
Platelets: 255 10*3/uL (ref 150–400)
RBC: 4.98 MIL/uL (ref 4.22–5.81)
RDW: 13.2 % (ref 11.5–15.5)
WBC: 5.9 10*3/uL (ref 4.0–10.5)
nRBC: 0 % (ref 0.0–0.2)

## 2019-04-02 LAB — DIFFERENTIAL
Abs Immature Granulocytes: 0.02 10*3/uL (ref 0.00–0.07)
Basophils Absolute: 0 10*3/uL (ref 0.0–0.1)
Basophils Relative: 1 %
Eosinophils Absolute: 0.1 10*3/uL (ref 0.0–0.5)
Eosinophils Relative: 2 %
Immature Granulocytes: 0 %
Lymphocytes Relative: 23 %
Lymphs Abs: 1.4 10*3/uL (ref 0.7–4.0)
Monocytes Absolute: 0.5 10*3/uL (ref 0.1–1.0)
Monocytes Relative: 9 %
Neutro Abs: 3.8 10*3/uL (ref 1.7–7.7)
Neutrophils Relative %: 65 %

## 2019-04-02 LAB — COMPREHENSIVE METABOLIC PANEL
ALT: 15 U/L (ref 0–44)
AST: 20 U/L (ref 15–41)
Albumin: 4.2 g/dL (ref 3.5–5.0)
Alkaline Phosphatase: 64 U/L (ref 38–126)
Anion gap: 10 (ref 5–15)
BUN: 15 mg/dL (ref 8–23)
CO2: 25 mmol/L (ref 22–32)
Calcium: 9.2 mg/dL (ref 8.9–10.3)
Chloride: 97 mmol/L — ABNORMAL LOW (ref 98–111)
Creatinine, Ser: 1.25 mg/dL — ABNORMAL HIGH (ref 0.61–1.24)
GFR calc Af Amer: >60 mL/min (ref >60)
GFR calc non Af Amer: >60 mL/min (ref >60)
Glucose, Bld: 100 mg/dL — ABNORMAL HIGH (ref 70–99)
Potassium: 4.2 mmol/L (ref 3.5–5.1)
Sodium: 132 mmol/L — ABNORMAL LOW (ref 135–145)
Total Bilirubin: 0.7 mg/dL (ref 0.3–1.2)
Total Protein: 6.7 g/dL (ref 6.5–8.1)

## 2019-04-02 LAB — PROTIME-INR
INR: 2.8 — ABNORMAL HIGH (ref 0.8–1.2)
Prothrombin Time: 29.4 seconds — ABNORMAL HIGH (ref 11.4–15.2)

## 2019-04-02 LAB — I-STAT CREATININE, ED: Creatinine, Ser: 1.2 mg/dL (ref 0.61–1.24)

## 2019-04-02 LAB — APTT: aPTT: 40 seconds — ABNORMAL HIGH (ref 24–36)

## 2019-04-02 LAB — HEMOGLOBIN A1C
Hgb A1c MFr Bld: 5.6 % (ref 4.8–5.6)
Mean Plasma Glucose: 114.02 mg/dL

## 2019-04-02 MED ORDER — SODIUM CHLORIDE 0.9% FLUSH: 3.0000 mL | Freq: Once | INTRAVENOUS | Status: DC

## 2019-04-02 MED ORDER — IOHEXOL 350 MG/ML SOLN
75.0000 mL | Freq: Once | INTRAVENOUS | Status: AC | PRN
  Administered 2019-04-02: 75 mL via INTRAVENOUS

## 2019-04-02 MED ORDER — ATORVASTATIN CALCIUM 40 MG PO TABS: 40.0000 mg | ORAL_TABLET | Freq: Every day | ORAL | Status: DC

## 2019-04-02 NOTE — Discharge Instructions (Signed)
As discussed, your evaluation today has been largely reassuring.  But, it is important that you monitor your condition carefully, and do not hesitate to return to the ED if you develop new, or concerning changes in your condition. ? ?Otherwise, please follow-up with your physician for appropriate ongoing care. ? ?

## 2019-04-02 NOTE — ED Notes (Signed)
Pt remains in MRI 

## 2019-04-02 NOTE — ED Provider Notes (Signed)
[facility] EMERGENCY DEPARTMENT Provider Note   CSN: [id number] Arrival date & time: 04/02/19  1811    History   Chief Complaint Chief Complaint  Patient presents with   Code Stroke    HPI [name] is a 63 y.o. male.     HPI Patient presents as a code stroke. Patient has a history of factor V Leiden deficiency, no prior stroke. He presents approximately 2 hours after initial neurologic disturbance. Patient was in his usual state of health, when he felt difficulty with speech.  On this resolved, but he has subsequently developed new vision difficulties, including blurry vision, and waxing, waning disturbance, including light sensitivity. This is all new, he has no history of similar changes. Patient denies extremity weakness, confusion, disorientation, nausea, vomiting.  Past Medical History:  Diagnosis Date   [facility]cer [facility])    prostate cancer   DVT (deep venous thrombosis) ([place]) 10/2006   Factor V Leiden ([place])    on chronic treatment   High frequency hearing loss    more per right ear / wears hearing aides   Hypertension    Nocturia    Pilonidal cyst    Pneumonia    age 27/viral   PONV (postoperative nausea and vomiting)    n/v years ago with ether   Tinnitus    more per right ear   Urinary tract infection    3 to 4 years ago     Patient Active Problem List   Diagnosis Date Noted   Primary osteoarthritis of left hip 09/04/2016   Factor V Leiden ([place])    Prostate cancer ([place]) 12/25/2014   Essential hypertension, benign 01/05/2014   Obesity 01/05/2014    Past Surgical History:  Procedure Laterality Date   colonscopy     x 3   CYSTOSCOPY WITH STENT PLACEMENT Left 12/25/2014   Procedure: CYSTOSCOPY WITH STENT PLACEMENT;  Surgeon: Raynelle Bring, MD;  Location: [facility];  Service: Urology;  Laterality: Left;   EYE SURGERY     lasik bilat    INGUINAL HERNIA REPAIR N/A 10/31/2015   Procedure: LAPAROSCOPIC BILATERAL  INGUINAL HERNIA , LEFT FEMEROL REPAIR WITH MESH AND PRIMARY UMBILICAL HERNIA REPAIR;  Surgeon: Michael Boston, MD;  Location: [facility];  Service: General;  Laterality: N/A;   LYMPHADENECTOMY Bilateral 12/25/2014   Procedure: Noel Journey;  Surgeon: Raynelle Bring, MD;  Location: [facility];  Service: Urology;  Laterality: Bilateral;   pilonidal cyst removal  1970's   ROBOT ASSISTED LAPAROSCOPIC RADICAL PROSTATECTOMY N/A 12/25/2014   Procedure: ROBOTIC ASSISTED LAPAROSCOPIC RADICAL PROSTATECTOMY LEVEL 2;  Surgeon: Raynelle Bring, MD;  Location: [facility];  Service: Urology;  Laterality: N/A;   ROBOTIC ASSISTED LAPAROSCOPIC BLADDER DIVERTICULECTOMY N/A 12/25/2014   Procedure: ROBOTIC ASSISTED LAPAROSCOPIC BLADDER DIVERTICULECTOMY;  Surgeon: Raynelle Bring, MD;  Location: [facility];  Service: Urology;  Laterality: N/A;   TONSILLECTOMY  as child   TOTAL HIP ARTHROPLASTY Left 09/04/2016   Procedure: LEFT TOTAL HIP ARTHROPLASTY ANTERIOR APPROACH;  Surgeon: Rod Can, MD;  Location: [facility];  Service: Orthopedics;  Laterality: Left;  Needs RNFA   UMBILICAL HERNIA REPAIR  10/31/2015   Procedure: HERNIA REPAIR UMBILICAL ADULT;  Surgeon: Michael Boston, MD;  Location: [facility];  Service: General;;   wisdom ttooth extraction  1978        Home Medications    Prior to Admission medications   Medication Sig Start Date End Date Taking? Authorizing Provider  celecoxib (CELEBREX) 200 MG capsule Take 200 mg by  mouth daily.    Yes [provider]  losartan-hydrochlorothiazide (HYZAAR) 50-12.5 MG tablet Take 1 tablet by mouth daily.   Yes [provider]  Melatonin 10 MG TABS Take 30 mg by mouth at bedtime.    Yes [provider]  Multiple Vitamins-Minerals (MULTIVITAMIN WITH MINERALS) tablet Take 0.5 tablets by mouth daily.    Yes [provider]  phentermine (ADIPEX-P) 37.5 MG tablet Take 18.75 mg by mouth daily before breakfast.   Yes [provider]  traZODone (DESYREL)  50 MG tablet Take 50 mg by mouth at bedtime as needed for sleep.   Yes [provider]  warfarin (COUMADIN) 10 MG tablet Take 1 tablet (10 mg total) by mouth every Tuesday, Thursday, Saturday, and Sunday at 6 PM. Patient taking differently: Take 10 mg by mouth daily.  11/03/15  Yes Michael Boston, MD    Family History Family History  Problem Relation Age of Onset   Pulmonary embolism Father     Social History Social History   Tobacco Use   Smoking status: Never Smoker   Smokeless tobacco: Never Used  Substance Use Topics   Alcohol use: No   Drug use: No     Allergies   Patient has no known allergies.   Review of Systems Review of Systems  Constitutional:       Per HPI, otherwise negative  HENT:       Per HPI, otherwise negative  Eyes: Positive for visual disturbance.  Respiratory:       Per HPI, otherwise negative  Cardiovascular:       Per HPI, otherwise negative  Gastrointestinal: Negative for vomiting.  Endocrine:       Negative aside from HPI  Genitourinary:       Neg aside from HPI   Musculoskeletal:       Per HPI, otherwise negative  Skin: Negative.   Neurological: Positive for speech difficulty. Negative for syncope.     Physical Exam Updated Vital Signs BP (!) 165/84    Pulse 67    Resp 10    Wt 127.8 kg    SpO2 97%    BMI 37.17 kg/m   Physical Exam Vitals signs and nursing note reviewed.  Constitutional:      General: He is not in acute distress.    Appearance: He is well-developed.  HENT:     Head: Normocephalic and atraumatic.  Eyes:     Conjunctiva/sclera: Conjunctivae normal.  Cardiovascular:     Rate and Rhythm: Normal rate and regular rhythm.  Pulmonary:     Effort: Pulmonary effort is normal. No respiratory distress.     Breath sounds: No stridor.  Abdominal:     General: There is no distension.  Skin:    General: Skin is warm and dry.  Neurological:     Mental Status: He is alert and oriented to person, place, and  time.     Motor: No weakness, tremor, atrophy or abnormal muscle tone.     Comments: No gross cranial nerve deficiencies, patient describes ongoing vision difficulties, however.      ED Treatments / Results  Labs (all labs ordered are listed, but only abnormal results are displayed) Labs Reviewed  PROTIME-INR - Abnormal; Notable for the following components:      Result Value   Prothrombin Time 29.4 (*)    INR 2.8 (*)    All other components within normal limits  APTT - Abnormal; Notable for the following components:  aPTT 40 (*)    All other components within normal limits  COMPREHENSIVE METABOLIC PANEL - Abnormal; Notable for the following components:   Sodium 132 (*)    Chloride 97 (*)    Glucose, Bld 100 (*)    Creatinine, Ser 1.25 (*)    All other components within normal limits  LIPID PANEL - Abnormal; Notable for the following components:   LDL Cholesterol 102 (*)    All other components within normal limits  CBC  DIFFERENTIAL  HEMOGLOBIN A1C  I-STAT CREATININE, ED  CBG MONITORING, ED    EKG EKG Interpretation  Date/Time:  Saturday Apr 02 2019 19:56:20 EDT Ventricular Rate:  61 PR Interval:    QRS Duration: 87 QT Interval:  425 QTC Calculation: 429 R Axis:   68 Text Interpretation:  Sinus rhythm Consider left atrial enlargement Abnormal ekg Confirmed by Carmin Muskrat [id number]) on 04/02/2019 8:00:27 PM   Radiology Ct Angio Head W Or Wo Contrast  Result Date: 04/02/2019 CLINICAL DATA:  Language disturbance, with symptoms now resolved. History of factor V Leiden deficiency. EXAM: CT ANGIOGRAPHY HEAD AND NECK TECHNIQUE: Multidetector CT imaging of the head and neck was performed using the standard protocol during bolus administration of intravenous contrast. Multiplanar CT image reconstructions and MIPs were obtained to evaluate the vascular anatomy. Carotid stenosis measurements (when applicable) are obtained utilizing NASCET criteria, using the distal internal  carotid diameter as the denominator. CONTRAST:  36mL OMNIPAQUE IOHEXOL 350 MG/ML SOLN COMPARISON:  None. FINDINGS: CTA NECK FINDINGS Aortic arch: Standard 3 vessel aortic arch with widely patent arch vessel origins. Right carotid system: Patent without evidence of stenosis or dissection. Minimal plaque at the carotid bifurcation. Retropharyngeal course of the proximal ICA. Left carotid system: Patent without evidence of stenosis or dissection. Minimal plaque at the carotid bifurcation. Vertebral arteries: Patent right vertebral artery without evidence of significant stenosis or dissection. Poor assessment of the proximal most left V1 segment including the vessel's origin with an underlying significant stenosis possible. Patent but diffusely small left vertebral artery throughout the remainder the V1, V2, and V3 segments. Skeleton: Moderate cervical disc degeneration. 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 Upper chest: Clear lung apices. Review of the MIP images confirms the above findings CTA HEAD FINDINGS Anterior circulation: The internal carotid arteries are patent from skull base to carotid termini without significant stenosis. ACAs and MCAs are patent with branch vessel irregularity but no evidence of proximal branch occlusion or significant proximal stenosis. The right A1 segment is hypoplastic. No aneurysm is identified. Posterior circulation: The intracranial right vertebral artery is widely patent and supplies the basilar. 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 The basilar artery is patent and congenitally small in caliber. The PCAs are patent with fetal origins bilaterally. There is a moderate to severe distal left P2 stenosis. No aneurysm is identified. Venous sinuses: Patent. Anatomic variants: Fetal origins of both PCAs. Review of the MIP images confirms the above findings IMPRESSION: 1. 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 Poor assessment of the  proximal left V1 segment. 2. Widely patent right vertebral artery. 3. 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 4. No carotid artery stenosis. Electronically Signed   By: Logan Bores M.D.   On: 04/02/2019 19:18   Ct Angio Neck W Or Wo Contrast  Result Date: 04/02/2019 CLINICAL DATA:  Language disturbance, with symptoms now resolved. History of factor V Leiden deficiency. EXAM: CT ANGIOGRAPHY HEAD AND NECK  TECHNIQUE: Multidetector CT imaging of the head and neck was performed using the standard protocol during bolus administration of intravenous contrast. Multiplanar CT image reconstructions and MIPs were obtained to evaluate the vascular anatomy. Carotid stenosis measurements (when applicable) are obtained utilizing NASCET criteria, using the distal internal carotid diameter as the denominator. CONTRAST:  57mL OMNIPAQUE IOHEXOL 350 MG/ML SOLN COMPARISON:  None. FINDINGS: CTA NECK FINDINGS Aortic arch: Standard 3 vessel aortic arch with widely patent arch vessel origins. Right carotid system: Patent without evidence of stenosis or dissection. Minimal plaque at the carotid bifurcation. Retropharyngeal course of the proximal ICA. Left carotid system: Patent without evidence of stenosis or dissection. Minimal plaque at the carotid bifurcation. Vertebral arteries: Patent right vertebral artery without evidence of significant stenosis or dissection. Poor assessment of the proximal most left V1 segment including the vessel's origin with an underlying significant stenosis possible. Patent but diffusely small left vertebral artery throughout the remainder the V1, V2, and V3 segments. Skeleton: Moderate cervical disc degeneration. 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 Upper chest: Clear lung apices. Review of the MIP images confirms the above findings CTA HEAD FINDINGS Anterior circulation: The internal carotid arteries are patent from skull base to carotid termini without significant stenosis. ACAs and MCAs are patent  with branch vessel irregularity but no evidence of proximal branch occlusion or significant proximal stenosis. The right A1 segment is hypoplastic. No aneurysm is identified. Posterior circulation: The intracranial right vertebral artery is widely patent and supplies the basilar. 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 The basilar artery is patent and congenitally small in caliber. The PCAs are patent with fetal origins bilaterally. There is a moderate to severe distal left P2 stenosis. No aneurysm is identified. Venous sinuses: Patent. Anatomic variants: Fetal origins of both PCAs. Review of the MIP images confirms the above findings IMPRESSION: 1. 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 Poor assessment of the proximal left V1 segment. 2. Widely patent right vertebral artery. 3. 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 4. No carotid artery stenosis. Electronically Signed   By: Logan Bores M.D.   On: 04/02/2019 19:18   Mr Brain Wo Contrast (neuro Protocol)  Result Date: 04/02/2019 CLINICAL DATA:  Initial evaluation for episodes of confusion, aphasia. EXAM: MRI HEAD WITHOUT CONTRAST TECHNIQUE: Multiplanar, multiecho pulse sequences of the brain and surrounding structures were obtained without intravenous contrast. COMPARISON:  Prior CT from earlier same day. FINDINGS: Brain: Generalized age appropriate cerebral atrophy. Few scattered nonspecific subcentimeter T2/FLAIR hyperintensities noted within the subcortical white matter both cerebral hemispheres, of doubtful significance. Small remote lacunar infarct present at the right basal ganglia. Additional small remote lacunar infarct at the left posterior periventricular white matter. No abnormal foci of restricted diffusion to suggest acute or subacute ischemia. Gray-white matter differentiation maintained. No encephalomalacia to suggest chronic cortical infarction. No evidence for acute or chronic  intracranial hemorrhage. No mass lesion, midline shift or mass effect. No hydrocephalus. No extra-axial fluid collection. 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 Finding felt to be incidental in nature and of doubtful clinical significance. Suprasellar region normal. Midline structures intact. Vascular: Poor visualization of the diminutive left vertebral artery, consistent with previously identified occlusion. Overall vertebrobasilar system is diminutive. Major intracranial vascular flow voids otherwise maintained. Skull and upper cervical spine: Craniocervical junction within normal limits. Scattered endplate osteophytic spurring noted within the  upper cervical spine. No significant stenosis. No focal marrow replacing lesion. No scalp soft tissue abnormality. Sinuses/Orbits: Globes and orbital soft tissues within normal limits. Paranasal sinuses and mastoid air cells are clear. Small Tornwaldt cyst noted at the nasopharynx. Inner ear structures normal. Other: None. IMPRESSION: 1. No acute intracranial infarct or other abnormality identified. 2. Small remote lacunar infarcts involving the right basal ganglia and left posterior periventricular white matter. 3. 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 Finding felt to be incidental in nature, and of doubtful clinical significance. Electronically Signed   By: Jeannine Boga M.D.   On: 04/02/2019 23:36   Ct Head Code Stroke Wo Contrast  Result Date: 04/02/2019 CLINICAL DATA:  Code stroke.  Aphasia.  Confusion. EXAM: CT HEAD WITHOUT CONTRAST TECHNIQUE: Contiguous axial images were obtained from the base of the skull through the vertex without intravenous contrast. COMPARISON:  None. FINDINGS: Brain: There is no evidence of acute infarct, intracranial hemorrhage, mass,  midline shift, or extra-axial fluid collection. There is a chronic infarct anteriorly in the right basal ganglia. The ventricles and sulci are normal. Vascular: Calcified atherosclerosis at the skull base. No hyperdense vessel. Skull: No fracture or focal osseous lesion. Sinuses/Orbits: Visualized paranasal sinuses and mastoid air cells are clear. Orbits are unremarkable. Other: None. ASPECTS [facility] Stroke Program Early CT Score) - Ganglionic level infarction (caudate, lentiform nuclei, internal capsule, insula, M1-M3 cortex): 7 - Supraganglionic infarction (M4-M6 cortex): 3 Total score (0-10 with 10 being normal): 10 IMPRESSION: 1. No evidence of acute intracranial abnormality. 2. ASPECTS is 10. 3. Chronic right basal ganglia infarct. These results were communicated to Dr. Lorraine Lax at [facility] pm on 04/02/2019 by text page via the [facility] messaging system. Electronically Signed   By: Logan Bores M.D.   On: 04/02/2019 18:42    Procedures Procedures (including critical care time)  Medications Ordered in ED Medications  sodium chloride flush (NS) 0.9 % injection 3 mL (has no administration in time range)  atorvastatin (LIPITOR) tablet 40 mg (has no administration in time range)  iohexol (OMNIPAQUE) 350 MG/ML injection 75 mL (75 mLs Intravenous Contrast Given 04/02/19 1855)     Initial Impression / Assessment and Plan / ED Course  I have reviewed the triage vital signs and the nursing notes.  Pertinent labs & imaging results that were available during my care of the patient were reviewed by me and considered in my medical decision making (see chart for details).    Exam patient is awake alert, vision has improved.  Patient has been seen and evaluated by neurology, and I have discussed his presentation with her neurologist at length. With consideration of the patient's anticoagulation, minimal deficiencies, indeed with resolution, patient is appropriate for discharge if MRI is essentially unremarkable.     11:42 PM No distress, awake, alert.  No ongoing visual complaints We discussed all findings again, at length including CT angiography with some diminished flow in his 1 vertebral artery, otherwise generally reassuring findings aside from possible old strokes. With no ongoing complaints, and appropriate anticoagulation level, after consideration with our neurology colleagues, the patient is appropriate for discharge with outpatient follow-up with the primary care and neurology Patient has a good relationship with his primary care physician understands importance of following up for additional stroke minimization, including carotid ultrasound, echocardiogram.   Final Clinical Impressions(s) / ED Diagnoses  Vision changes   Carmin Muskrat, MD 04/02/19 2344

## 2019-04-02 NOTE — Consult Note (Addendum)
Requesting Physician: Dr. Vanita Panda    Chief Complaint:   History obtained from: Patient and Chart     HPI:                                                                                                                                       Anthony Mckinney is an 63 y.o. male the past medical history of hypertension, factor V Leiden deficiency, DVT on Coumadin, prostate cancer presents to the emergency room after having 2 reading around 4:30 PM today.  Last known normal/symptom onset was 4:30 PM today when patient was reading a book.  He suddenly realized that the words did not make any sense.  This lasted for about 10 minutes.  Symptoms resolved spontaneously.  Then around 5:30 PM, he noticed this again and his wife brought the patient to the emergency room via private vehicle there is no obvious facial droop, slurred speech.  He denies any weakness numbness in his arms/legs is able to walk normally.  332 systolic on arrival.  On assessment NIH stroke scale was 0.  Patient states that he has gotten better. CT head  was obtained, showed no acute findings.  Date last known well: 5.2.20 Time last known well: 4.30 pm  tPA Given: no, symptoms resolved NIHSS: 0  Baseline MRS 0   Past Medical History:  Diagnosis Date  . Cancer [facility])    prostate cancer  . DVT (deep venous thrombosis) ([place]) 10/2006  . Factor V Leiden ([place])    on chronic treatment  . High frequency hearing loss    more per right ear / wears hearing aides  . Hypertension   . Nocturia   . Pilonidal cyst   . Pneumonia    age 52/viral  . PONV (postoperative nausea and vomiting)    n/v years ago with ether  . Tinnitus    more per right ear  . Urinary tract infection    3 to 4 years ago     Past Surgical History:  Procedure Laterality Date  . colonscopy     x 3  . CYSTOSCOPY WITH STENT PLACEMENT Left 12/25/2014   Procedure: CYSTOSCOPY WITH STENT PLACEMENT;  Surgeon: Raynelle Bring, MD;  Location: [facility];  Service:  Urology;  Laterality: Left;  . EYE SURGERY     lasik bilat   . INGUINAL HERNIA REPAIR N/A 10/31/2015   Procedure: LAPAROSCOPIC BILATERAL INGUINAL HERNIA , LEFT FEMEROL REPAIR WITH MESH AND PRIMARY UMBILICAL HERNIA REPAIR;  Surgeon: Michael Boston, MD;  Location: [facility];  Service: General;  Laterality: N/A;  . LYMPHADENECTOMY Bilateral 12/25/2014   Procedure: Noel Journey;  Surgeon: Raynelle Bring, MD;  Location: [facility];  Service: Urology;  Laterality: Bilateral;  . pilonidal cyst removal  1970's  . ROBOT ASSISTED LAPAROSCOPIC RADICAL PROSTATECTOMY N/A 12/25/2014   Procedure: ROBOTIC ASSISTED LAPAROSCOPIC RADICAL PROSTATECTOMY LEVEL 2;  Surgeon: Raynelle Bring, MD;  Location:  [facility];  Service: Urology;  Laterality: N/A;  . ROBOTIC ASSISTED LAPAROSCOPIC BLADDER DIVERTICULECTOMY N/A 12/25/2014   Procedure: ROBOTIC ASSISTED LAPAROSCOPIC BLADDER DIVERTICULECTOMY;  Surgeon: Raynelle Bring, MD;  Location: [facility];  Service: Urology;  Laterality: N/A;  . TONSILLECTOMY  as child  . TOTAL HIP ARTHROPLASTY Left 09/04/2016   Procedure: LEFT TOTAL HIP ARTHROPLASTY ANTERIOR APPROACH;  Surgeon: Rod Can, MD;  Location: [facility];  Service: Orthopedics;  Laterality: Left;  Needs RNFA  . UMBILICAL HERNIA REPAIR  10/31/2015   Procedure: HERNIA REPAIR UMBILICAL ADULT;  Surgeon: Michael Boston, MD;  Location: [facility];  Service: General;;  . wisdom ttooth extraction  1978    Family History  Problem Relation Age of Onset  . Pulmonary embolism Father    Social History:  reports that he has never smoked. He has never used smokeless tobacco. He reports that he does not drink alcohol or use drugs.  Allergies: No Known Allergies  Medications:                                                                                                                        I reviewed home medications. On coumadin at home.    ROS:                                                                                                                                      14 systems reviewed and negative except above    Examination:                                                                                                      General: Appears well-developed. Psych: Affect appropriate to situation Eyes: No scleral injection HENT: No OP obstrucion Head: Normocephalic.  Cardiovascular: Normal rate and regular rhythm.  Respiratory: Effort normal and breath sounds normal to anterior ascultation GI: Soft.  No distension. There is no tenderness.  Skin: WDI    Neurological Examination Mental Status: Alert, oriented, thought content  appropriate.  Speech fluent without evidence of aphasia. Able to follow 3 step commands without difficulty.  He is able to read without any difficulty.  Repetition/naming intact. Cranial Nerves: II: Visual fields grossly normal,  III,IV, VI: ptosis not present, extra-ocular motions intact bilaterally, pupils equal, round, reactive to light and accommodation V,VII: smile symmetric, facial light touch sensation normal bilaterally VIII: hearing normal bilaterally IX,X: uvula rises symmetrically XI: bilateral shoulder shrug XII: midline tongue extension Motor: Right : Upper extremity   5/5    Left:     Upper extremity   5/5  Lower extremity   5/5     Lower extremity   5/5 Tone and bulk:normal tone throughout; no atrophy noted Sensory: Pinprick and light touch intact throughout, bilaterally.  No neglect.  Deep Tendon Reflexes: 2+ and symmetric throughout Plantars: Right: downgoing   Left: downgoing Cerebellar: normal finger-to-nose, normal rapid alternating movements and normal heel-to-shin test Gait: Not assessed     Lab Results: Basic Metabolic Panel: Recent Labs  Lab 04/02/19 1828  CREATININE 1.20    CBC: Recent Labs  Lab 04/02/19 1826  WBC 5.9  NEUTROABS 3.8  HGB 15.4  HCT 44.7  MCV 89.8  PLT 255    Coagulation Studies: No results for input(s): LABPROT, INR in the last  72 hours.  Imaging: Ct Head Code Stroke Wo Contrast  Result Date: 04/02/2019 CLINICAL DATA:  Code stroke.  Aphasia.  Confusion. EXAM: CT HEAD WITHOUT CONTRAST TECHNIQUE: Contiguous axial images were obtained from the base of the skull through the vertex without intravenous contrast. COMPARISON:  None. FINDINGS: Brain: There is no evidence of acute infarct, intracranial hemorrhage, mass, midline shift, or extra-axial fluid collection. There is a chronic infarct anteriorly in the right basal ganglia. The ventricles and sulci are normal. Vascular: Calcified atherosclerosis at the skull base. No hyperdense vessel. Skull: No fracture or focal osseous lesion. Sinuses/Orbits: Visualized paranasal sinuses and mastoid air cells are clear. Orbits are unremarkable. Other: None. ASPECTS [facility] Stroke Program Early CT Score) - Ganglionic level infarction (caudate, lentiform nuclei, internal capsule, insula, M1-M3 cortex): 7 - Supraganglionic infarction (M4-M6 cortex): 3 Total score (0-10 with 10 being normal): 10 IMPRESSION: 1. No evidence of acute intracranial abnormality. 2. ASPECTS is 10. 3. Chronic right basal ganglia infarct. These results were communicated to Dr. Lorraine Lax at [facility] pm on 04/02/2019 by text page via the [facility] messaging system. Electronically Signed   By: Logan Bores M.D.   On: 04/02/2019 18:42     ASSESSMENT AND PLAN  Mr. Shappell is a 63 year old male with past medical history of prostate cancer, hypertension and factor V Leiden deficiency on Coumadin, DVT who presented to the ER with language difficulty.  Was stroke alerted on arrival-subsequently improved by the time he was assessed in the CT scanner. TPA was not administered as symptoms have resolved.  NIH stroke scale 0. INR level still pending.  I suspect patient likely had transient ischemic attack.  Rule ahead and obtain CT angiogram to make sure he has no LVO/severe stenosis.   Transient Ischemic Attack /Acute Ischemic Stroke    Risk  factors: Factor V Leiden deficiency,HTN  Recommendations # MRI of the brain without contrast #Transthoracic Echo  # Continue Coumadin #Start Statin 40 mg daily # HBAIC and Lipid profile -please complete test in ER, results can be followed up as outpt #stroke swallow screen  If MRI negative for acute stroke, patient can be discharged home with close follow-up as outpatient.  Counseled patient's on  signs and symptoms of a stroke ( BEFAST) and told to come back to the ER if he experiences any of them. Patient already on therapeutic anticoagulation and therefore I do not think he needs an urgent echocardiogram as this will likely not change management acutely.  Rest of TIA work-up has been completed in the ER   Carlos Quackenbush [facility] Pager Number [phone]

## 2019-04-02 NOTE — ED Triage Notes (Signed)
Pt arrives via pov with reports of aphasia and visual disturbances at 1630 and cleared up. Symptoms reoccurred around 1730 and cleared up en route to the ED. Code stroke activated. LKW 1630. Hx factor 5, pt on coumadin.

## 2019-04-03 NOTE — ED Notes (Signed)
Discharge instructions and follow up care discussed with pt. Pt has no questions at this time.

## 2019-08-09 ENCOUNTER — Other Ambulatory Visit: Payer: Self-pay

## 2019-08-09 ENCOUNTER — Ambulatory Visit (INDEPENDENT_AMBULATORY_CARE_PROVIDER_SITE_OTHER): Payer: Medicare HMO | Admitting: Neurology

## 2019-08-09 ENCOUNTER — Encounter: Payer: Self-pay | Admitting: Neurology

## 2019-08-09 VITALS — BP 122/83 | HR 94 | Temp 97.8°F | Ht 73.0 in | Wt 281.0 lb

## 2019-08-09 DIAGNOSIS — H53461 Homonymous bilateral field defects, right side: Secondary | ICD-10-CM | POA: Diagnosis not present

## 2019-08-09 DIAGNOSIS — E7849 Other hyperlipidemia: Secondary | ICD-10-CM

## 2019-08-09 DIAGNOSIS — E7800 Pure hypercholesterolemia, unspecified: Secondary | ICD-10-CM | POA: Diagnosis not present

## 2019-08-09 DIAGNOSIS — Z8673 Personal history of transient ischemic attack (TIA), and cerebral infarction without residual deficits: Secondary | ICD-10-CM | POA: Diagnosis not present

## 2019-08-09 DIAGNOSIS — H9190 Unspecified hearing loss, unspecified ear: Secondary | ICD-10-CM | POA: Diagnosis not present

## 2019-08-09 DIAGNOSIS — Z7901 Long term (current) use of anticoagulants: Secondary | ICD-10-CM | POA: Diagnosis not present

## 2019-08-09 DIAGNOSIS — G459 Transient cerebral ischemic attack, unspecified: Secondary | ICD-10-CM

## 2019-08-09 NOTE — Patient Instructions (Signed)
I had a long d/w patient about his recent TIA,, factor V Leiden, risk for recurrent stroke/TIAs, personally independently reviewed imaging studies and stroke evaluation results and answered questions.Continue warfarin daily  for secondary stroke prevention for now but consider switching to Eliquis as he had of embolic event despite being on warfarin and to discuss this with his primary physician.  And maintain strict control of hypertension with blood pressure goal below 130/90, diabetes with hemoglobin A1c goal below 6.5% and lipids with LDL cholesterol goal below 70 mg/dL. I also advised the patient to eat a healthy diet with plenty of whole grains, cereals, fruits and vegetables, exercise regularly and maintain ideal body weight.  Check TEE for PFO and if found may need to consider endovascular closure due to failure of anticoagulation.  Check follow-up lipid panel, hepatic function labs.  Followup in the future with me in 2 months or call earlier if necessary.  Stroke Prevention Some medical conditions and behaviors are associated with a higher chance of having a stroke. You can help prevent a stroke by making nutrition, lifestyle, and other changes, including managing any medical conditions you may have. What nutrition changes can be made?   Eat healthy foods. You can do this by: ? Choosing foods high in fiber, such as fresh fruits and vegetables and whole grains. ? Eating at least 5 or more servings of fruits and vegetables a day. Try to fill half of your plate at each meal with fruits and vegetables. ? Choosing lean protein foods, such as lean cuts of meat, poultry without skin, fish, tofu, beans, and nuts. ? Eating low-fat dairy products. ? Avoiding foods that are high in salt (sodium). This can help lower blood pressure. ? Avoiding foods that have saturated fat, trans fat, and cholesterol. This can help prevent high cholesterol. ? Avoiding processed and premade foods.  Follow your health  care provider's specific guidelines for losing weight, controlling high blood pressure (hypertension), lowering high cholesterol, and managing diabetes. These may include: ? Reducing your daily calorie intake. ? Limiting your daily sodium intake to 1,500 milligrams (mg). ? Using only healthy fats for cooking, such as olive oil, canola oil, or sunflower oil. ? Counting your daily carbohydrate intake. What lifestyle changes can be made?  Maintain a healthy weight. Talk to your health care provider about your ideal weight.  Get at least 30 minutes of moderate physical activity at least 5 days a week. Moderate activity includes brisk walking, biking, and swimming.  Do not use any products that contain nicotine or tobacco, such as cigarettes and e-cigarettes. If you need help quitting, ask your health care provider. It may also be helpful to avoid exposure to secondhand smoke.  Limit alcohol intake to no more than 1 drink a day for nonpregnant women and 2 drinks a day for men. One drink equals 12 oz of beer, 5 oz of wine, or 1 oz of hard liquor.  Stop any illegal drug use.  Avoid taking birth control pills. Talk to your health care provider about the risks of taking birth control pills if: ? You are over 48 years old. ? You smoke. ? You get migraines. ? You have ever had a blood clot. What other changes can be made?  Manage your cholesterol levels. ? Eating a healthy diet is important for preventing high cholesterol. If cholesterol cannot be managed through diet alone, you may also need to take medicines. ? Take any prescribed medicines to control your cholesterol as told  by your health care provider.  Manage your diabetes. ? Eating a healthy diet and exercising regularly are important parts of managing your blood sugar. If your blood sugar cannot be managed through diet and exercise, you may need to take medicines. ? Take any prescribed medicines to control your diabetes as told by your  health care provider.  Control your hypertension. ? To reduce your risk of stroke, try to keep your blood pressure below 130/80. ? Eating a healthy diet and exercising regularly are an important part of controlling your blood pressure. If your blood pressure cannot be managed through diet and exercise, you may need to take medicines. ? Take any prescribed medicines to control hypertension as told by your health care provider. ? Ask your health care provider if you should monitor your blood pressure at home. ? Have your blood pressure checked every year, even if your blood pressure is normal. Blood pressure increases with age and some medical conditions.  Get evaluated for sleep disorders (sleep apnea). Talk to your health care provider about getting a sleep evaluation if you snore a lot or have excessive sleepiness.  Take over-the-counter and prescription medicines only as told by your health care provider. Aspirin or blood thinners (antiplatelets or anticoagulants) may be recommended to reduce your risk of forming blood clots that can lead to stroke.  Make sure that any other medical conditions you have, such as atrial fibrillation or atherosclerosis, are managed. What are the warning signs of a stroke? The warning signs of a stroke can be easily remembered as BEFAST.  B is for balance. Signs include: ? Dizziness. ? Loss of balance or coordination. ? Sudden trouble walking.  E is for eyes. Signs include: ? A sudden change in vision. ? Trouble seeing.  F is for face. Signs include: ? Sudden weakness or numbness of the face. ? The face or eyelid drooping to one side.  A is for arms. Signs include: ? Sudden weakness or numbness of the arm, usually on one side of the body.  S is for speech. Signs include: ? Trouble speaking (aphasia). ? Trouble understanding.  T is for time. ? These symptoms may represent a serious problem that is an emergency. Do not wait to see if the symptoms  will go away. Get medical help right away. Call your local emergency services (911 in the [place] Do not drive yourself to the hospital.  Other signs of stroke may include: ? A sudden, severe headache with no known cause. ? Nausea or vomiting. ? Seizure. Where to find more information For more information, visit:  American Stroke Association: www.strokeassociation.org  National Stroke Association: www.stroke.org Summary  You can prevent a stroke by eating healthy, exercising, not smoking, limiting alcohol intake, and managing any medical conditions you may have.  Do not use any products that contain nicotine or tobacco, such as cigarettes and e-cigarettes. If you need help quitting, ask your health care provider. It may also be helpful to avoid exposure to secondhand smoke.  Remember BEFAST for warning signs of stroke. Get help right away if you or a loved one has any of these signs. This information is not intended to replace advice given to you by your health care provider. Make sure you discuss any questions you have with your health care provider. Document Released: 12/25/2004 Document Revised: 10/30/2017 Document Reviewed: 12/23/2016 Elsevier Patient Education  2020 [employer].

## 2019-08-09 NOTE — Progress Notes (Signed)
[facility] [address] [place]. [place] [address] [phone]       OFFICE CONSULT NOTE  [name] Date of Birth:  [birth year] Medical Record Number:  [id number]   Referring MD: Anthony Mckinney  Reason for Referral: TIA  HPI: Anthony Mckinney is a 63 year old Caucasian male seen today for initial office consultation visit for TIA.  History is obtained from the patient, review of referral notes, electronic medical records and I personally reviewed imaging films in PACS.  He presented to [facility] emergency room on 04/02/2019 with sudden sudden onset vision difficulties while reading a book he had trouble making sense of the words and felt headache was missing the end of the words and had to move his head to scan.  He also had trouble formulating words and speaking.  He came to the emergency room at [facility] where his symptoms started resolving.  He was evaluated by ER physician as well as by Dr. Lorraine Mckinney neuro hospitalist and was found to have NIH stroke scale of 0.  He was on warfarin for his history of factor V Leiden deficiency and DVT and INR was optimal at 2.8.  MRI scan of the brain was obtained which I personally reviewed shows no acute stroke.  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  LDL cholesterol was elevated at 102 mg percent and hemoglobin A1c at 5.6.  Echocardiogram was not performed as it would not change his management.  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  The left posterior cerebral artery had moderate narrowing in the P2 segment patient was started on Crestor which is tolerating well  without muscle aches and pains.  He has not had any follow-up lipid profile checked since then.  His INR has been fairly steady and is had no bleeding or oozing.  Patient denies any history of sleep apnea, excessive daytime sleepiness or snoring.  He has no prior history of TIAs or strokes.  He has had no recurrent stroke or TIA symptoms since his ER visit 4 months ago.  ROS:   14 system review of systems is positive for left hip pain, numbness and all other systems negative  PMH:  Past Medical History:  Diagnosis Date   Acute embolism and thrombosis of deep vein of both lower extremities (HCC)    Cancer (HCC)    prostate cancer   DVT (deep venous thrombosis) ([place]) 10/2006   Factor V Leiden ([place])    on chronic treatment   High frequency hearing loss    more per right ear / wears hearing aides   Hypertension    Nocturia    Osteoarthritis    Pilonidal cyst    Pneumonia    age 26/viral   PONV (postoperative nausea and vomiting)    n/v years ago with ether   Sleep disturbance    Tinnitus    more per right ear   Urinary tract infection    3 to 4 years ago     Social History:  Social History   Socioeconomic History   Marital status: Married    Spouse name: Not on file  Number of children: Not on file   Years of education: Not on file   Highest education level: Not on file  Occupational History   Not on file  Social Needs   Financial resource strain: Not on file   Food insecurity    Worry: Not on file    Inability: Not on file   Transportation needs    Medical: Not on file    Non-medical: Not on file  Tobacco Use   Smoking status: Never Smoker   Smokeless tobacco: Never Used  Substance and Sexual Activity   Alcohol use: No   Drug use: No   Sexual activity: Not on file  Lifestyle   Physical activity    Days per week: Not on file    Minutes per session: Not on file   Stress: Not on file  Relationships   Social connections    Talks  on phone: Not on file    Gets together: Not on file    Attends religious service: Not on file    Active member of club or organization: Not on file    Attends meetings of clubs or organizations: Not on file    Relationship status: Not on file   Intimate partner violence    Fear of current or ex partner: Not on file    Emotionally abused: Not on file    Physically abused: Not on file    Forced sexual activity: Not on file  Other Topics Concern   Not on file  Social History Narrative   Not on file    Medications:   Current Outpatient Medications on File Prior to Visit  Medication Sig Dispense Refill   celecoxib (CELEBREX) 200 MG capsule Take 200 mg by mouth daily.      losartan-hydrochlorothiazide (HYZAAR) 50-12.5 MG tablet Take 1 tablet by mouth daily.     Melatonin 10 MG TABS Take 30 mg by mouth at bedtime.      Multiple Vitamins-Minerals (MULTIVITAMIN WITH MINERALS) tablet Take 0.5 tablets by mouth daily.      rosuvastatin (CRESTOR) 20 MG tablet Take 20 mg by mouth daily.     traZODone (DESYREL) 50 MG tablet Take 50 mg by mouth at bedtime as needed for sleep.     warfarin (COUMADIN) 10 MG tablet Take 1 tablet (10 mg total) by mouth every Tuesday, Thursday, Saturday, and Sunday at 6 PM. (Patient taking differently: Take 10 mg by mouth daily. ) 30 tablet 2   No current facility-administered medications on file prior to visit.     Allergies:  No Known Allergies  Physical Exam General: Obese middle-aged Caucasian male, seated, in no evident distress Head: head normocephalic and atraumatic.   Neck: supple with no carotid or supraclavicular bruits Cardiovascular: regular rate and rhythm, no murmurs Musculoskeletal: no deformity Skin:  no rash/petichiae Vascular:  Normal pulses all extremities  Neurologic Exam Mental Status: Awake and fully alert. Oriented to place and time. Recent and remote memory intact. Attention span, concentration and fund of knowledge  appropriate. Mood and affect appropriate.  Cranial Nerves: Fundoscopic exam reveals sharp disc margins. Pupils equal, briskly reactive to light. Extraocular movements full without nystagmus. Visual fields full to confrontation. Hearing intact. Facial sensation intact. Face, tongue, palate moves normally and symmetrically.  Motor: Normal bulk and tone. Normal strength in all tested extremity muscles. Sensory.: intact to touch , pinprick , position and vibratory sensation.  Except mild subjective numbness in the lateral aspect of the left hip and upper  thigh. Coordination: Rapid alternating movements normal in all extremities. Finger-to-nose and heel-to-shin performed accurately bilaterally. Gait and Station: Arises from chair without difficulty. Stance is normal. Gait demonstrates normal stride length and balance with slight favoring of the left hip.. Able to heel, toe and tandem walk without difficulty.  Reflexes: 1+ and symmetric. Toes downgoing.   NIHSS  0 Modified Rankin  1   ASSESSMENT: 63 year old Caucasian male with transient episode of right hemifield vision loss and confusion and disorientation likely due to left hemispheric posterior circulation TIA in May [id number] likely of embolic etiology given abnormal findings of the left posterior cerebral artery.  Patient does have a history of factor V Leiden with DVT and is on chronic anticoagulation with warfarin.  Vascular risk factors of hypertension, hyperlipidemia, obesity and hypercoagulability from factor V Leiden with suspected PFO    PLAN: I had a long d/w patient about his recent TIA,, factor V Leiden, risk for recurrent stroke/TIAs, personally independently reviewed imaging studies and stroke evaluation results and answered questions.Continue warfarin daily  for secondary stroke prevention for now but consider switching to Eliquis as he had of embolic event despite being on warfarin and to discuss this with his primary physician.  And  maintain strict control of hypertension with blood pressure goal below 130/90, diabetes with hemoglobin A1c goal below 6.5% and lipids with LDL cholesterol goal below 70 mg/dL. I also advised the patient to eat a healthy diet with plenty of whole grains, cereals, fruits and vegetables, exercise regularly and maintain ideal body weight.  Check TEE for PFO and if found may need to consider endovascular closure due to failure of anticoagulation.  Check follow-up lipid panel, hepatic function labs.  Greater than 50% time during this 45-minute consultation visit was spent on counseling and coordination of care about his TIA and discussion about risk for recurrent strokes TIAs and answering questions followup in the future with me in 2 months or call earlier if necessary. Antony Contras, MD  [facility] [address] [place], [place] [address]  Phone [phone] Fax [phone] Note: This document was prepared with digital dictation and possible smart phrase technology. Any transcriptional errors that result from this process are unintentional.

## 2019-08-10 DIAGNOSIS — Z8546 Personal history of malignant neoplasm of prostate: Secondary | ICD-10-CM | POA: Diagnosis not present

## 2019-08-17 DIAGNOSIS — N5201 Erectile dysfunction due to arterial insufficiency: Secondary | ICD-10-CM | POA: Diagnosis not present

## 2019-08-17 DIAGNOSIS — K4090 Unilateral inguinal hernia, without obstruction or gangrene, not specified as recurrent: Secondary | ICD-10-CM | POA: Diagnosis not present

## 2019-08-17 DIAGNOSIS — Z8546 Personal history of malignant neoplasm of prostate: Secondary | ICD-10-CM | POA: Diagnosis not present

## 2019-10-31 ENCOUNTER — Ambulatory Visit: Payer: Self-pay | Admitting: Neurology

## 2019-10-31 ENCOUNTER — Encounter: Payer: Self-pay | Admitting: Neurology

## 2019-11-01 ENCOUNTER — Telehealth: Payer: Self-pay

## 2019-11-01 DIAGNOSIS — X32XXXD Exposure to sunlight, subsequent encounter: Secondary | ICD-10-CM | POA: Diagnosis not present

## 2019-11-01 DIAGNOSIS — L570 Actinic keratosis: Secondary | ICD-10-CM | POA: Diagnosis not present

## 2019-11-01 NOTE — Telephone Encounter (Signed)
PT no show for appt on 10/31/2019.

## 2019-12-18 DIAGNOSIS — Z20822 Contact with and (suspected) exposure to covid-19: Secondary | ICD-10-CM | POA: Diagnosis not present

## 2019-12-18 DIAGNOSIS — Z6836 Body mass index (BMI) 36.0-36.9, adult: Secondary | ICD-10-CM | POA: Diagnosis not present

## 2019-12-19 DIAGNOSIS — Z20822 Contact with and (suspected) exposure to covid-19: Secondary | ICD-10-CM | POA: Diagnosis not present

## 2020-04-23 DIAGNOSIS — I10 Essential (primary) hypertension: Secondary | ICD-10-CM | POA: Diagnosis not present

## 2020-04-23 DIAGNOSIS — D6869 Other thrombophilia: Secondary | ICD-10-CM | POA: Diagnosis not present

## 2020-04-23 DIAGNOSIS — E7800 Pure hypercholesterolemia, unspecified: Secondary | ICD-10-CM | POA: Diagnosis not present

## 2020-04-23 DIAGNOSIS — D6851 Activated protein C resistance: Secondary | ICD-10-CM | POA: Diagnosis not present

## 2020-04-24 DIAGNOSIS — M1990 Unspecified osteoarthritis, unspecified site: Secondary | ICD-10-CM | POA: Diagnosis not present

## 2020-04-24 DIAGNOSIS — G479 Sleep disorder, unspecified: Secondary | ICD-10-CM | POA: Diagnosis not present

## 2020-04-24 DIAGNOSIS — Z1159 Encounter for screening for other viral diseases: Secondary | ICD-10-CM | POA: Diagnosis not present

## 2020-04-24 DIAGNOSIS — D6869 Other thrombophilia: Secondary | ICD-10-CM | POA: Diagnosis not present

## 2020-04-24 DIAGNOSIS — D6851 Activated protein C resistance: Secondary | ICD-10-CM | POA: Diagnosis not present

## 2020-04-24 DIAGNOSIS — Z0000 Encounter for general adult medical examination without abnormal findings: Secondary | ICD-10-CM | POA: Diagnosis not present

## 2020-04-24 DIAGNOSIS — I10 Essential (primary) hypertension: Secondary | ICD-10-CM | POA: Diagnosis not present

## 2020-04-24 DIAGNOSIS — E7800 Pure hypercholesterolemia, unspecified: Secondary | ICD-10-CM | POA: Diagnosis not present

## 2020-04-24 DIAGNOSIS — Z23 Encounter for immunization: Secondary | ICD-10-CM | POA: Diagnosis not present

## 2020-06-18 DIAGNOSIS — L570 Actinic keratosis: Secondary | ICD-10-CM | POA: Diagnosis not present

## 2020-06-18 DIAGNOSIS — X32XXXD Exposure to sunlight, subsequent encounter: Secondary | ICD-10-CM | POA: Diagnosis not present

## 2020-08-22 DIAGNOSIS — Z8546 Personal history of malignant neoplasm of prostate: Secondary | ICD-10-CM | POA: Diagnosis not present

## 2020-08-24 DIAGNOSIS — Z20822 Contact with and (suspected) exposure to covid-19: Secondary | ICD-10-CM | POA: Diagnosis not present

## 2020-08-29 DIAGNOSIS — N5201 Erectile dysfunction due to arterial insufficiency: Secondary | ICD-10-CM | POA: Diagnosis not present

## 2020-08-29 DIAGNOSIS — Z8546 Personal history of malignant neoplasm of prostate: Secondary | ICD-10-CM | POA: Diagnosis not present

## 2020-11-20 DIAGNOSIS — H903 Sensorineural hearing loss, bilateral: Secondary | ICD-10-CM | POA: Diagnosis not present

## 2020-12-25 DIAGNOSIS — Z7901 Long term (current) use of anticoagulants: Secondary | ICD-10-CM | POA: Diagnosis not present

## 2021-01-10 DIAGNOSIS — D6851 Activated protein C resistance: Secondary | ICD-10-CM | POA: Diagnosis not present

## 2021-01-10 DIAGNOSIS — Z7901 Long term (current) use of anticoagulants: Secondary | ICD-10-CM | POA: Diagnosis not present

## 2021-03-18 DIAGNOSIS — D6851 Activated protein C resistance: Secondary | ICD-10-CM | POA: Diagnosis not present

## 2021-03-18 DIAGNOSIS — Z7901 Long term (current) use of anticoagulants: Secondary | ICD-10-CM | POA: Diagnosis not present

## 2021-04-23 DIAGNOSIS — N4341 Spermatocele of epididymis, single: Secondary | ICD-10-CM | POA: Diagnosis not present

## 2021-05-26 DIAGNOSIS — Z20822 Contact with and (suspected) exposure to covid-19: Secondary | ICD-10-CM | POA: Diagnosis not present

## 2021-06-17 DIAGNOSIS — G479 Sleep disorder, unspecified: Secondary | ICD-10-CM | POA: Diagnosis not present

## 2021-06-17 DIAGNOSIS — I10 Essential (primary) hypertension: Secondary | ICD-10-CM | POA: Diagnosis not present

## 2021-06-17 DIAGNOSIS — N1831 Chronic kidney disease, stage 3a: Secondary | ICD-10-CM | POA: Diagnosis not present

## 2021-06-17 DIAGNOSIS — D6851 Activated protein C resistance: Secondary | ICD-10-CM | POA: Diagnosis not present

## 2021-06-17 DIAGNOSIS — D6869 Other thrombophilia: Secondary | ICD-10-CM | POA: Diagnosis not present

## 2021-06-17 DIAGNOSIS — Z7901 Long term (current) use of anticoagulants: Secondary | ICD-10-CM | POA: Diagnosis not present

## 2021-06-17 DIAGNOSIS — I679 Cerebrovascular disease, unspecified: Secondary | ICD-10-CM | POA: Diagnosis not present

## 2021-06-17 DIAGNOSIS — E7800 Pure hypercholesterolemia, unspecified: Secondary | ICD-10-CM | POA: Diagnosis not present

## 2021-06-17 DIAGNOSIS — Z0000 Encounter for general adult medical examination without abnormal findings: Secondary | ICD-10-CM | POA: Diagnosis not present

## 2021-06-21 DIAGNOSIS — D225 Melanocytic nevi of trunk: Secondary | ICD-10-CM | POA: Diagnosis not present

## 2021-06-21 DIAGNOSIS — X32XXXD Exposure to sunlight, subsequent encounter: Secondary | ICD-10-CM | POA: Diagnosis not present

## 2021-06-21 DIAGNOSIS — L570 Actinic keratosis: Secondary | ICD-10-CM | POA: Diagnosis not present

## 2021-07-29 DIAGNOSIS — M25562 Pain in left knee: Secondary | ICD-10-CM | POA: Diagnosis not present

## 2021-07-29 DIAGNOSIS — M1712 Unilateral primary osteoarthritis, left knee: Secondary | ICD-10-CM | POA: Diagnosis not present

## 2021-07-29 DIAGNOSIS — R58 Hemorrhage, not elsewhere classified: Secondary | ICD-10-CM | POA: Diagnosis not present

## 2021-07-29 DIAGNOSIS — Z7901 Long term (current) use of anticoagulants: Secondary | ICD-10-CM | POA: Diagnosis not present

## 2021-08-12 DIAGNOSIS — Z7901 Long term (current) use of anticoagulants: Secondary | ICD-10-CM | POA: Diagnosis not present

## 2021-08-26 DIAGNOSIS — Z8546 Personal history of malignant neoplasm of prostate: Secondary | ICD-10-CM | POA: Diagnosis not present

## 2021-09-09 DIAGNOSIS — L570 Actinic keratosis: Secondary | ICD-10-CM | POA: Diagnosis not present

## 2021-09-09 DIAGNOSIS — X32XXXD Exposure to sunlight, subsequent encounter: Secondary | ICD-10-CM | POA: Diagnosis not present

## 2021-09-09 DIAGNOSIS — Z8546 Personal history of malignant neoplasm of prostate: Secondary | ICD-10-CM | POA: Diagnosis not present

## 2021-09-09 DIAGNOSIS — N5201 Erectile dysfunction due to arterial insufficiency: Secondary | ICD-10-CM | POA: Diagnosis not present

## 2021-09-11 DIAGNOSIS — R791 Abnormal coagulation profile: Secondary | ICD-10-CM | POA: Diagnosis not present

## 2021-09-11 DIAGNOSIS — Z20822 Contact with and (suspected) exposure to covid-19: Secondary | ICD-10-CM | POA: Diagnosis not present

## 2021-09-11 DIAGNOSIS — R0789 Other chest pain: Secondary | ICD-10-CM | POA: Diagnosis not present

## 2021-09-11 DIAGNOSIS — Z86718 Personal history of other venous thrombosis and embolism: Secondary | ICD-10-CM | POA: Diagnosis not present

## 2021-09-11 DIAGNOSIS — R0602 Shortness of breath: Secondary | ICD-10-CM | POA: Diagnosis not present

## 2021-09-11 DIAGNOSIS — J90 Pleural effusion, not elsewhere classified: Secondary | ICD-10-CM | POA: Diagnosis not present

## 2021-09-11 DIAGNOSIS — Z8546 Personal history of malignant neoplasm of prostate: Secondary | ICD-10-CM | POA: Diagnosis not present

## 2021-09-11 DIAGNOSIS — Z7901 Long term (current) use of anticoagulants: Secondary | ICD-10-CM | POA: Diagnosis not present

## 2021-09-11 DIAGNOSIS — R079 Chest pain, unspecified: Secondary | ICD-10-CM | POA: Diagnosis not present

## 2021-09-16 DIAGNOSIS — R9389 Abnormal findings on diagnostic imaging of other specified body structures: Secondary | ICD-10-CM | POA: Diagnosis not present

## 2021-09-16 DIAGNOSIS — D6851 Activated protein C resistance: Secondary | ICD-10-CM | POA: Diagnosis not present

## 2021-09-16 DIAGNOSIS — R899 Unspecified abnormal finding in specimens from other organs, systems and tissues: Secondary | ICD-10-CM | POA: Diagnosis not present

## 2021-09-16 DIAGNOSIS — Z7901 Long term (current) use of anticoagulants: Secondary | ICD-10-CM | POA: Diagnosis not present

## 2021-09-16 DIAGNOSIS — J90 Pleural effusion, not elsewhere classified: Secondary | ICD-10-CM | POA: Diagnosis not present

## 2021-09-16 DIAGNOSIS — Z09 Encounter for follow-up examination after completed treatment for conditions other than malignant neoplasm: Secondary | ICD-10-CM | POA: Diagnosis not present

## 2021-09-17 ENCOUNTER — Other Ambulatory Visit: Payer: Self-pay | Admitting: Family Medicine

## 2021-09-17 DIAGNOSIS — R9389 Abnormal findings on diagnostic imaging of other specified body structures: Secondary | ICD-10-CM

## 2021-10-09 DIAGNOSIS — Z8546 Personal history of malignant neoplasm of prostate: Secondary | ICD-10-CM | POA: Diagnosis not present

## 2021-10-09 DIAGNOSIS — Z8701 Personal history of pneumonia (recurrent): Secondary | ICD-10-CM | POA: Diagnosis not present

## 2021-10-09 DIAGNOSIS — R079 Chest pain, unspecified: Secondary | ICD-10-CM | POA: Diagnosis not present

## 2021-10-09 DIAGNOSIS — I10 Essential (primary) hypertension: Secondary | ICD-10-CM | POA: Diagnosis not present

## 2021-10-09 DIAGNOSIS — R0602 Shortness of breath: Secondary | ICD-10-CM | POA: Diagnosis not present

## 2021-10-09 DIAGNOSIS — Z7901 Long term (current) use of anticoagulants: Secondary | ICD-10-CM | POA: Diagnosis not present

## 2021-10-09 DIAGNOSIS — R059 Cough, unspecified: Secondary | ICD-10-CM | POA: Diagnosis not present

## 2021-10-09 DIAGNOSIS — Z86718 Personal history of other venous thrombosis and embolism: Secondary | ICD-10-CM | POA: Diagnosis not present

## 2021-10-09 DIAGNOSIS — R072 Precordial pain: Secondary | ICD-10-CM | POA: Diagnosis not present

## 2021-10-09 DIAGNOSIS — Z79899 Other long term (current) drug therapy: Secondary | ICD-10-CM | POA: Diagnosis not present

## 2021-10-14 DIAGNOSIS — H43812 Vitreous degeneration, left eye: Secondary | ICD-10-CM | POA: Diagnosis not present

## 2021-11-19 DIAGNOSIS — D6851 Activated protein C resistance: Secondary | ICD-10-CM | POA: Diagnosis not present

## 2021-11-19 DIAGNOSIS — R002 Palpitations: Secondary | ICD-10-CM | POA: Diagnosis not present

## 2021-11-19 DIAGNOSIS — R0789 Other chest pain: Secondary | ICD-10-CM | POA: Diagnosis not present

## 2021-11-19 DIAGNOSIS — I208 Other forms of angina pectoris: Secondary | ICD-10-CM | POA: Diagnosis not present

## 2021-11-19 DIAGNOSIS — E669 Obesity, unspecified: Secondary | ICD-10-CM | POA: Diagnosis not present

## 2021-12-02 DIAGNOSIS — Z79899 Other long term (current) drug therapy: Secondary | ICD-10-CM | POA: Diagnosis not present

## 2021-12-02 DIAGNOSIS — Z8546 Personal history of malignant neoplasm of prostate: Secondary | ICD-10-CM | POA: Diagnosis not present

## 2021-12-02 DIAGNOSIS — Z86718 Personal history of other venous thrombosis and embolism: Secondary | ICD-10-CM | POA: Diagnosis not present

## 2021-12-02 DIAGNOSIS — I10 Essential (primary) hypertension: Secondary | ICD-10-CM | POA: Diagnosis not present

## 2021-12-02 DIAGNOSIS — M7989 Other specified soft tissue disorders: Secondary | ICD-10-CM | POA: Diagnosis not present

## 2021-12-02 DIAGNOSIS — Z7901 Long term (current) use of anticoagulants: Secondary | ICD-10-CM | POA: Diagnosis not present

## 2021-12-02 DIAGNOSIS — X58XXXA Exposure to other specified factors, initial encounter: Secondary | ICD-10-CM | POA: Diagnosis not present

## 2021-12-02 DIAGNOSIS — S8012XA Contusion of left lower leg, initial encounter: Secondary | ICD-10-CM | POA: Diagnosis not present

## 2021-12-18 DIAGNOSIS — G479 Sleep disorder, unspecified: Secondary | ICD-10-CM | POA: Diagnosis not present

## 2021-12-18 DIAGNOSIS — M7989 Other specified soft tissue disorders: Secondary | ICD-10-CM | POA: Diagnosis not present

## 2021-12-18 DIAGNOSIS — I10 Essential (primary) hypertension: Secondary | ICD-10-CM | POA: Diagnosis not present

## 2021-12-24 DIAGNOSIS — E669 Obesity, unspecified: Secondary | ICD-10-CM | POA: Diagnosis not present

## 2021-12-24 DIAGNOSIS — R002 Palpitations: Secondary | ICD-10-CM | POA: Diagnosis not present

## 2021-12-24 DIAGNOSIS — R0789 Other chest pain: Secondary | ICD-10-CM | POA: Diagnosis not present

## 2022-02-24 DIAGNOSIS — I208 Other forms of angina pectoris: Secondary | ICD-10-CM | POA: Diagnosis not present

## 2022-02-24 DIAGNOSIS — R0789 Other chest pain: Secondary | ICD-10-CM | POA: Diagnosis not present

## 2022-02-24 DIAGNOSIS — R002 Palpitations: Secondary | ICD-10-CM | POA: Diagnosis not present

## 2022-04-08 DIAGNOSIS — L570 Actinic keratosis: Secondary | ICD-10-CM | POA: Diagnosis not present

## 2022-04-08 DIAGNOSIS — L821 Other seborrheic keratosis: Secondary | ICD-10-CM | POA: Diagnosis not present

## 2022-04-08 DIAGNOSIS — L308 Other specified dermatitis: Secondary | ICD-10-CM | POA: Diagnosis not present

## 2022-04-08 DIAGNOSIS — D485 Neoplasm of uncertain behavior of skin: Secondary | ICD-10-CM | POA: Diagnosis not present

## 2022-04-08 DIAGNOSIS — D225 Melanocytic nevi of trunk: Secondary | ICD-10-CM | POA: Diagnosis not present

## 2022-04-08 DIAGNOSIS — X32XXXD Exposure to sunlight, subsequent encounter: Secondary | ICD-10-CM | POA: Diagnosis not present

## 2022-05-12 DIAGNOSIS — L570 Actinic keratosis: Secondary | ICD-10-CM | POA: Diagnosis not present

## 2022-05-12 DIAGNOSIS — H2513 Age-related nuclear cataract, bilateral: Secondary | ICD-10-CM | POA: Diagnosis not present

## 2022-05-12 DIAGNOSIS — D225 Melanocytic nevi of trunk: Secondary | ICD-10-CM | POA: Diagnosis not present

## 2022-05-12 DIAGNOSIS — L308 Other specified dermatitis: Secondary | ICD-10-CM | POA: Diagnosis not present

## 2022-05-12 DIAGNOSIS — H04123 Dry eye syndrome of bilateral lacrimal glands: Secondary | ICD-10-CM | POA: Diagnosis not present

## 2022-05-12 DIAGNOSIS — L821 Other seborrheic keratosis: Secondary | ICD-10-CM | POA: Diagnosis not present

## 2022-05-12 DIAGNOSIS — X32XXXD Exposure to sunlight, subsequent encounter: Secondary | ICD-10-CM | POA: Diagnosis not present

## 2022-05-12 DIAGNOSIS — H43812 Vitreous degeneration, left eye: Secondary | ICD-10-CM | POA: Diagnosis not present

## 2022-05-12 DIAGNOSIS — H524 Presbyopia: Secondary | ICD-10-CM | POA: Diagnosis not present

## 2022-06-25 DIAGNOSIS — H903 Sensorineural hearing loss, bilateral: Secondary | ICD-10-CM | POA: Diagnosis not present

## 2022-06-25 DIAGNOSIS — H9313 Tinnitus, bilateral: Secondary | ICD-10-CM | POA: Diagnosis not present

## 2022-06-26 DIAGNOSIS — U071 COVID-19: Secondary | ICD-10-CM | POA: Diagnosis not present

## 2022-07-21 DIAGNOSIS — I10 Essential (primary) hypertension: Secondary | ICD-10-CM | POA: Diagnosis not present

## 2022-07-21 DIAGNOSIS — E7800 Pure hypercholesterolemia, unspecified: Secondary | ICD-10-CM | POA: Diagnosis not present

## 2022-07-21 DIAGNOSIS — G479 Sleep disorder, unspecified: Secondary | ICD-10-CM | POA: Diagnosis not present

## 2022-07-21 DIAGNOSIS — Z0000 Encounter for general adult medical examination without abnormal findings: Secondary | ICD-10-CM | POA: Diagnosis not present

## 2022-07-21 DIAGNOSIS — D6851 Activated protein C resistance: Secondary | ICD-10-CM | POA: Diagnosis not present

## 2022-07-21 DIAGNOSIS — Z79899 Other long term (current) drug therapy: Secondary | ICD-10-CM | POA: Diagnosis not present

## 2022-07-21 DIAGNOSIS — M1990 Unspecified osteoarthritis, unspecified site: Secondary | ICD-10-CM | POA: Diagnosis not present

## 2022-07-21 DIAGNOSIS — D6859 Other primary thrombophilia: Secondary | ICD-10-CM | POA: Diagnosis not present

## 2022-08-03 DIAGNOSIS — R059 Cough, unspecified: Secondary | ICD-10-CM | POA: Diagnosis not present

## 2022-08-03 DIAGNOSIS — J22 Unspecified acute lower respiratory infection: Secondary | ICD-10-CM | POA: Diagnosis not present

## 2022-08-03 DIAGNOSIS — R918 Other nonspecific abnormal finding of lung field: Secondary | ICD-10-CM | POA: Diagnosis not present

## 2022-08-03 DIAGNOSIS — J9811 Atelectasis: Secondary | ICD-10-CM | POA: Diagnosis not present

## 2022-08-03 DIAGNOSIS — R062 Wheezing: Secondary | ICD-10-CM | POA: Diagnosis not present

## 2022-08-03 DIAGNOSIS — J9801 Acute bronchospasm: Secondary | ICD-10-CM | POA: Diagnosis not present

## 2022-08-26 DIAGNOSIS — M79641 Pain in right hand: Secondary | ICD-10-CM | POA: Diagnosis not present

## 2022-09-23 DIAGNOSIS — Z8546 Personal history of malignant neoplasm of prostate: Secondary | ICD-10-CM | POA: Diagnosis not present

## 2022-10-21 DIAGNOSIS — I872 Venous insufficiency (chronic) (peripheral): Secondary | ICD-10-CM | POA: Diagnosis not present

## 2022-10-21 DIAGNOSIS — B070 Plantar wart: Secondary | ICD-10-CM | POA: Diagnosis not present

## 2022-10-21 DIAGNOSIS — L570 Actinic keratosis: Secondary | ICD-10-CM | POA: Diagnosis not present

## 2022-10-21 DIAGNOSIS — X32XXXD Exposure to sunlight, subsequent encounter: Secondary | ICD-10-CM | POA: Diagnosis not present

## 2022-11-06 DIAGNOSIS — N5201 Erectile dysfunction due to arterial insufficiency: Secondary | ICD-10-CM | POA: Diagnosis not present

## 2022-11-06 DIAGNOSIS — R351 Nocturia: Secondary | ICD-10-CM | POA: Diagnosis not present

## 2023-01-14 DIAGNOSIS — L820 Inflamed seborrheic keratosis: Secondary | ICD-10-CM | POA: Diagnosis not present

## 2023-01-14 DIAGNOSIS — L304 Erythema intertrigo: Secondary | ICD-10-CM | POA: Diagnosis not present

## 2023-01-14 DIAGNOSIS — L308 Other specified dermatitis: Secondary | ICD-10-CM | POA: Diagnosis not present

## 2023-01-14 DIAGNOSIS — L259 Unspecified contact dermatitis, unspecified cause: Secondary | ICD-10-CM | POA: Diagnosis not present

## 2023-01-14 DIAGNOSIS — L281 Prurigo nodularis: Secondary | ICD-10-CM | POA: Diagnosis not present

## 2023-02-05 DIAGNOSIS — D6851 Activated protein C resistance: Secondary | ICD-10-CM | POA: Diagnosis not present

## 2023-02-05 DIAGNOSIS — G479 Sleep disorder, unspecified: Secondary | ICD-10-CM | POA: Diagnosis not present

## 2023-02-05 DIAGNOSIS — M79643 Pain in unspecified hand: Secondary | ICD-10-CM | POA: Diagnosis not present

## 2023-03-19 DIAGNOSIS — M064 Inflammatory polyarthropathy: Secondary | ICD-10-CM | POA: Diagnosis not present

## 2023-03-19 DIAGNOSIS — R5383 Other fatigue: Secondary | ICD-10-CM | POA: Diagnosis not present

## 2023-03-19 DIAGNOSIS — M79641 Pain in right hand: Secondary | ICD-10-CM | POA: Diagnosis not present

## 2023-03-19 DIAGNOSIS — G5602 Carpal tunnel syndrome, left upper limb: Secondary | ICD-10-CM | POA: Diagnosis not present

## 2023-03-19 DIAGNOSIS — M79642 Pain in left hand: Secondary | ICD-10-CM | POA: Diagnosis not present

## 2023-03-19 DIAGNOSIS — G5601 Carpal tunnel syndrome, right upper limb: Secondary | ICD-10-CM | POA: Diagnosis not present

## 2023-03-19 DIAGNOSIS — M1991 Primary osteoarthritis, unspecified site: Secondary | ICD-10-CM | POA: Diagnosis not present

## 2023-04-09 DIAGNOSIS — M79641 Pain in right hand: Secondary | ICD-10-CM | POA: Diagnosis not present

## 2023-04-09 DIAGNOSIS — M79642 Pain in left hand: Secondary | ICD-10-CM | POA: Diagnosis not present

## 2023-04-09 DIAGNOSIS — M0600 Rheumatoid arthritis without rheumatoid factor, unspecified site: Secondary | ICD-10-CM | POA: Diagnosis not present

## 2023-04-09 DIAGNOSIS — M1991 Primary osteoarthritis, unspecified site: Secondary | ICD-10-CM | POA: Diagnosis not present

## 2023-07-22 DIAGNOSIS — M1991 Primary osteoarthritis, unspecified site: Secondary | ICD-10-CM | POA: Diagnosis not present

## 2023-07-22 DIAGNOSIS — Z79899 Other long term (current) drug therapy: Secondary | ICD-10-CM | POA: Diagnosis not present

## 2023-07-22 DIAGNOSIS — G5601 Carpal tunnel syndrome, right upper limb: Secondary | ICD-10-CM | POA: Diagnosis not present

## 2023-07-22 DIAGNOSIS — M064 Inflammatory polyarthropathy: Secondary | ICD-10-CM | POA: Diagnosis not present

## 2023-07-22 DIAGNOSIS — M79642 Pain in left hand: Secondary | ICD-10-CM | POA: Diagnosis not present

## 2023-07-22 DIAGNOSIS — G5602 Carpal tunnel syndrome, left upper limb: Secondary | ICD-10-CM | POA: Diagnosis not present

## 2023-07-22 DIAGNOSIS — M79641 Pain in right hand: Secondary | ICD-10-CM | POA: Diagnosis not present

## 2023-07-22 DIAGNOSIS — M0600 Rheumatoid arthritis without rheumatoid factor, unspecified site: Secondary | ICD-10-CM | POA: Diagnosis not present

## 2023-08-06 DIAGNOSIS — Z86010 Personal history of colonic polyps: Secondary | ICD-10-CM | POA: Diagnosis not present

## 2023-08-06 DIAGNOSIS — D6851 Activated protein C resistance: Secondary | ICD-10-CM | POA: Diagnosis not present

## 2023-08-06 DIAGNOSIS — M069 Rheumatoid arthritis, unspecified: Secondary | ICD-10-CM | POA: Diagnosis not present

## 2023-08-06 DIAGNOSIS — J45909 Unspecified asthma, uncomplicated: Secondary | ICD-10-CM | POA: Diagnosis not present

## 2023-08-06 DIAGNOSIS — I10 Essential (primary) hypertension: Secondary | ICD-10-CM | POA: Diagnosis not present

## 2023-08-06 DIAGNOSIS — Z0000 Encounter for general adult medical examination without abnormal findings: Secondary | ICD-10-CM | POA: Diagnosis not present

## 2023-08-06 DIAGNOSIS — E7800 Pure hypercholesterolemia, unspecified: Secondary | ICD-10-CM | POA: Diagnosis not present

## 2023-08-06 DIAGNOSIS — Z23 Encounter for immunization: Secondary | ICD-10-CM | POA: Diagnosis not present

## 2023-09-10 DIAGNOSIS — L308 Other specified dermatitis: Secondary | ICD-10-CM | POA: Diagnosis not present

## 2023-09-10 DIAGNOSIS — L821 Other seborrheic keratosis: Secondary | ICD-10-CM | POA: Diagnosis not present

## 2023-12-01 DIAGNOSIS — Z09 Encounter for follow-up examination after completed treatment for conditions other than malignant neoplasm: Secondary | ICD-10-CM | POA: Diagnosis not present

## 2023-12-01 DIAGNOSIS — D123 Benign neoplasm of transverse colon: Secondary | ICD-10-CM | POA: Diagnosis not present

## 2023-12-01 DIAGNOSIS — D120 Benign neoplasm of cecum: Secondary | ICD-10-CM | POA: Diagnosis not present

## 2023-12-01 DIAGNOSIS — K648 Other hemorrhoids: Secondary | ICD-10-CM | POA: Diagnosis not present

## 2023-12-01 DIAGNOSIS — K635 Polyp of colon: Secondary | ICD-10-CM | POA: Diagnosis not present

## 2023-12-01 DIAGNOSIS — Z860100 Personal history of colon polyps, unspecified: Secondary | ICD-10-CM | POA: Diagnosis not present

## 2023-12-01 DIAGNOSIS — K5730 Diverticulosis of large intestine without perforation or abscess without bleeding: Secondary | ICD-10-CM | POA: Diagnosis not present

## 2024-01-18 DIAGNOSIS — R030 Elevated blood-pressure reading, without diagnosis of hypertension: Secondary | ICD-10-CM | POA: Diagnosis not present

## 2024-01-18 DIAGNOSIS — R0602 Shortness of breath: Secondary | ICD-10-CM | POA: Diagnosis not present

## 2024-02-08 DIAGNOSIS — I10 Essential (primary) hypertension: Secondary | ICD-10-CM | POA: Diagnosis not present

## 2024-02-08 DIAGNOSIS — I679 Cerebrovascular disease, unspecified: Secondary | ICD-10-CM | POA: Diagnosis not present

## 2024-02-10 DIAGNOSIS — L820 Inflamed seborrheic keratosis: Secondary | ICD-10-CM | POA: Diagnosis not present

## 2024-02-10 DIAGNOSIS — L570 Actinic keratosis: Secondary | ICD-10-CM | POA: Diagnosis not present

## 2024-02-10 DIAGNOSIS — D225 Melanocytic nevi of trunk: Secondary | ICD-10-CM | POA: Diagnosis not present

## 2024-02-10 DIAGNOSIS — X32XXXD Exposure to sunlight, subsequent encounter: Secondary | ICD-10-CM | POA: Diagnosis not present

## 2024-02-17 DIAGNOSIS — J45909 Unspecified asthma, uncomplicated: Secondary | ICD-10-CM | POA: Diagnosis not present

## 2024-02-17 DIAGNOSIS — D120 Benign neoplasm of cecum: Secondary | ICD-10-CM | POA: Diagnosis not present

## 2024-02-17 DIAGNOSIS — D121 Benign neoplasm of appendix: Secondary | ICD-10-CM | POA: Diagnosis not present

## 2024-02-17 DIAGNOSIS — D369 Benign neoplasm, unspecified site: Secondary | ICD-10-CM | POA: Diagnosis not present

## 2024-02-17 DIAGNOSIS — Z8673 Personal history of transient ischemic attack (TIA), and cerebral infarction without residual deficits: Secondary | ICD-10-CM | POA: Diagnosis not present

## 2024-02-17 DIAGNOSIS — M1990 Unspecified osteoarthritis, unspecified site: Secondary | ICD-10-CM | POA: Diagnosis not present

## 2024-02-17 DIAGNOSIS — Z7901 Long term (current) use of anticoagulants: Secondary | ICD-10-CM | POA: Diagnosis not present

## 2024-02-17 DIAGNOSIS — K635 Polyp of colon: Secondary | ICD-10-CM | POA: Diagnosis not present

## 2024-02-17 DIAGNOSIS — M0600 Rheumatoid arthritis without rheumatoid factor, unspecified site: Secondary | ICD-10-CM | POA: Diagnosis not present

## 2024-02-17 DIAGNOSIS — D122 Benign neoplasm of ascending colon: Secondary | ICD-10-CM | POA: Diagnosis not present

## 2024-02-17 DIAGNOSIS — I129 Hypertensive chronic kidney disease with stage 1 through stage 4 chronic kidney disease, or unspecified chronic kidney disease: Secondary | ICD-10-CM | POA: Diagnosis not present

## 2024-02-17 DIAGNOSIS — Z974 Presence of external hearing-aid: Secondary | ICD-10-CM | POA: Diagnosis not present

## 2024-02-17 DIAGNOSIS — Z79899 Other long term (current) drug therapy: Secondary | ICD-10-CM | POA: Diagnosis not present

## 2024-02-17 DIAGNOSIS — N1831 Chronic kidney disease, stage 3a: Secondary | ICD-10-CM | POA: Diagnosis not present

## 2024-02-17 DIAGNOSIS — K5730 Diverticulosis of large intestine without perforation or abscess without bleeding: Secondary | ICD-10-CM | POA: Diagnosis not present

## 2024-02-17 DIAGNOSIS — D123 Benign neoplasm of transverse colon: Secondary | ICD-10-CM | POA: Diagnosis not present

## 2024-02-17 DIAGNOSIS — D689 Coagulation defect, unspecified: Secondary | ICD-10-CM | POA: Diagnosis not present

## 2024-02-17 DIAGNOSIS — H9193 Unspecified hearing loss, bilateral: Secondary | ICD-10-CM | POA: Diagnosis not present

## 2024-02-17 DIAGNOSIS — Z860100 Personal history of colon polyps, unspecified: Secondary | ICD-10-CM | POA: Diagnosis not present

## 2024-02-17 DIAGNOSIS — Z86718 Personal history of other venous thrombosis and embolism: Secondary | ICD-10-CM | POA: Diagnosis not present

## 2024-03-02 DIAGNOSIS — M1991 Primary osteoarthritis, unspecified site: Secondary | ICD-10-CM | POA: Diagnosis not present

## 2024-03-02 DIAGNOSIS — M0600 Rheumatoid arthritis without rheumatoid factor, unspecified site: Secondary | ICD-10-CM | POA: Diagnosis not present

## 2024-03-02 DIAGNOSIS — Z79899 Other long term (current) drug therapy: Secondary | ICD-10-CM | POA: Diagnosis not present

## 2024-04-18 DIAGNOSIS — H2513 Age-related nuclear cataract, bilateral: Secondary | ICD-10-CM | POA: Diagnosis not present

## 2024-04-18 DIAGNOSIS — H04123 Dry eye syndrome of bilateral lacrimal glands: Secondary | ICD-10-CM | POA: Diagnosis not present

## 2024-04-18 DIAGNOSIS — H5203 Hypermetropia, bilateral: Secondary | ICD-10-CM | POA: Diagnosis not present

## 2024-04-18 DIAGNOSIS — H524 Presbyopia: Secondary | ICD-10-CM | POA: Diagnosis not present

## 2024-04-18 DIAGNOSIS — H52203 Unspecified astigmatism, bilateral: Secondary | ICD-10-CM | POA: Diagnosis not present

## 2024-04-18 DIAGNOSIS — H35373 Puckering of macula, bilateral: Secondary | ICD-10-CM | POA: Diagnosis not present

## 2024-04-18 DIAGNOSIS — H25013 Cortical age-related cataract, bilateral: Secondary | ICD-10-CM | POA: Diagnosis not present

## 2024-04-18 DIAGNOSIS — H43813 Vitreous degeneration, bilateral: Secondary | ICD-10-CM | POA: Diagnosis not present

## 2024-06-15 DIAGNOSIS — E7800 Pure hypercholesterolemia, unspecified: Secondary | ICD-10-CM | POA: Diagnosis not present

## 2024-06-15 DIAGNOSIS — M2550 Pain in unspecified joint: Secondary | ICD-10-CM | POA: Diagnosis not present

## 2024-06-15 DIAGNOSIS — I10 Essential (primary) hypertension: Secondary | ICD-10-CM | POA: Diagnosis not present

## 2024-06-15 DIAGNOSIS — D6851 Activated protein C resistance: Secondary | ICD-10-CM | POA: Diagnosis not present

## 2024-08-31 DIAGNOSIS — M79641 Pain in right hand: Secondary | ICD-10-CM | POA: Diagnosis not present

## 2024-08-31 DIAGNOSIS — M79642 Pain in left hand: Secondary | ICD-10-CM | POA: Diagnosis not present

## 2024-08-31 DIAGNOSIS — M1991 Primary osteoarthritis, unspecified site: Secondary | ICD-10-CM | POA: Diagnosis not present

## 2024-08-31 DIAGNOSIS — R5383 Other fatigue: Secondary | ICD-10-CM | POA: Diagnosis not present

## 2024-08-31 DIAGNOSIS — M0600 Rheumatoid arthritis without rheumatoid factor, unspecified site: Secondary | ICD-10-CM | POA: Diagnosis not present

## 2024-08-31 DIAGNOSIS — Z79899 Other long term (current) drug therapy: Secondary | ICD-10-CM | POA: Diagnosis not present
